# Patient Record
Sex: Male | Born: 1950 | Race: White | Hispanic: No | Smoking: Former smoker
Health system: Southern US, Community
[De-identification: ages and names within clinical notes are randomized; demographics above are authoritative.]

## PROBLEM LIST (undated history)

## (undated) DIAGNOSIS — F329 Major depressive disorder, single episode, unspecified: Secondary | ICD-10-CM

## (undated) DIAGNOSIS — K219 Gastro-esophageal reflux disease without esophagitis: Secondary | ICD-10-CM

## (undated) DIAGNOSIS — F32A Depression, unspecified: Secondary | ICD-10-CM

## (undated) DIAGNOSIS — E7800 Pure hypercholesterolemia, unspecified: Secondary | ICD-10-CM

## (undated) DIAGNOSIS — I10 Essential (primary) hypertension: Secondary | ICD-10-CM

## (undated) DIAGNOSIS — J449 Chronic obstructive pulmonary disease, unspecified: Secondary | ICD-10-CM

## (undated) DIAGNOSIS — E119 Type 2 diabetes mellitus without complications: Secondary | ICD-10-CM

## (undated) DIAGNOSIS — K4090 Unilateral inguinal hernia, without obstruction or gangrene, not specified as recurrent: Secondary | ICD-10-CM

## (undated) HISTORY — PX: HERNIA REPAIR: SHX51

---

## 2017-10-29 ENCOUNTER — Other Ambulatory Visit: Payer: Self-pay

## 2017-10-29 ENCOUNTER — Emergency Department (HOSPITAL_COMMUNITY): Payer: Self-pay

## 2017-10-29 ENCOUNTER — Encounter (HOSPITAL_COMMUNITY): Payer: Self-pay | Admitting: Emergency Medicine

## 2017-10-29 ENCOUNTER — Emergency Department (HOSPITAL_COMMUNITY)
Admission: EM | Admit: 2017-10-29 | Discharge: 2017-10-29 | Disposition: A | Discharge status: Skilled Nursing Facility | Payer: Self-pay | Attending: Emergency Medicine | Admitting: Emergency Medicine

## 2017-10-29 DIAGNOSIS — J449 Chronic obstructive pulmonary disease, unspecified: Secondary | ICD-10-CM | POA: Insufficient documentation

## 2017-10-29 DIAGNOSIS — Y999 Unspecified external cause status: Secondary | ICD-10-CM | POA: Insufficient documentation

## 2017-10-29 DIAGNOSIS — I10 Essential (primary) hypertension: Secondary | ICD-10-CM | POA: Insufficient documentation

## 2017-10-29 DIAGNOSIS — Y9301 Activity, walking, marching and hiking: Secondary | ICD-10-CM | POA: Insufficient documentation

## 2017-10-29 DIAGNOSIS — M25551 Pain in right hip: Secondary | ICD-10-CM

## 2017-10-29 DIAGNOSIS — W19XXXA Unspecified fall, initial encounter: Secondary | ICD-10-CM

## 2017-10-29 DIAGNOSIS — Y9210 Unspecified residential institution as the place of occurrence of the external cause: Secondary | ICD-10-CM | POA: Insufficient documentation

## 2017-10-29 DIAGNOSIS — E119 Type 2 diabetes mellitus without complications: Secondary | ICD-10-CM | POA: Insufficient documentation

## 2017-10-29 HISTORY — DX: Pure hypercholesterolemia, unspecified: E78.00

## 2017-10-29 HISTORY — DX: Depression, unspecified: F32.A

## 2017-10-29 HISTORY — DX: Type 2 diabetes mellitus without complications: E11.9

## 2017-10-29 HISTORY — DX: Major depressive disorder, single episode, unspecified: F32.9

## 2017-10-29 HISTORY — DX: Gastro-esophageal reflux disease without esophagitis: K21.9

## 2017-10-29 HISTORY — DX: Essential (primary) hypertension: I10

## 2017-10-29 HISTORY — DX: Chronic obstructive pulmonary disease, unspecified: J44.9

## 2017-10-29 LAB — I-STAT CHEM 8, ED
BUN: 35 mg/dL — AB (ref 8–23)
CHLORIDE: 101 mmol/L (ref 98–111)
Calcium, Ion: 1.12 mmol/L — ABNORMAL LOW (ref 1.15–1.40)
Creatinine, Ser: 2.3 mg/dL — ABNORMAL HIGH (ref 0.61–1.24)
GLUCOSE: 91 mg/dL (ref 70–99)
HCT: 30 % — ABNORMAL LOW (ref 39.0–52.0)
Hemoglobin: 10.2 g/dL — ABNORMAL LOW (ref 13.0–17.0)
POTASSIUM: 3.3 mmol/L — AB (ref 3.5–5.1)
SODIUM: 139 mmol/L (ref 135–145)
TCO2: 26 mmol/L (ref 22–32)

## 2017-10-29 LAB — CK: CK TOTAL: 27 U/L — AB (ref 49–397)

## 2017-10-29 MED ORDER — BACITRACIN-NEOMYCIN-POLYMYXIN 400-5-5000 EX OINT
TOPICAL_OINTMENT | CUTANEOUS | Status: DC
Start: 2017-10-27 — End: 2017-10-29

## 2017-10-29 MED ORDER — GENERIC EXTERNAL MEDICATION
Status: DC
Start: ? — End: 2017-10-29

## 2017-10-29 MED ORDER — DULOXETINE HCL 30 MG PO CPEP
60.00 | ORAL_CAPSULE | ORAL | Status: DC
Start: 2017-10-28 — End: 2017-10-29

## 2017-10-29 MED ORDER — SENNOSIDES 8.6 MG PO TABS
17.20 | ORAL_TABLET | ORAL | Status: DC
Start: 2017-10-27 — End: 2017-10-29

## 2017-10-29 MED ORDER — FOLIC ACID 1 MG PO TABS
1.00 | ORAL_TABLET | ORAL | Status: DC
Start: 2017-10-28 — End: 2017-10-29

## 2017-10-29 MED ORDER — ALBUTEROL SULFATE (2.5 MG/3ML) 0.083% IN NEBU
2.50 | INHALATION_SOLUTION | RESPIRATORY_TRACT | Status: DC
Start: ? — End: 2017-10-29

## 2017-10-29 MED ORDER — LIDOCAINE 5 % EX PTCH
1.00 | MEDICATED_PATCH | CUTANEOUS | Status: DC
Start: 2017-10-28 — End: 2017-10-29

## 2017-10-29 MED ORDER — THIAMINE HCL 100 MG PO TABS
100.00 | ORAL_TABLET | ORAL | Status: DC
Start: 2017-10-28 — End: 2017-10-29

## 2017-10-29 MED ORDER — ERYTHROMYCIN 5 MG/GM OP OINT
TOPICAL_OINTMENT | OPHTHALMIC | Status: DC
Start: 2017-10-27 — End: 2017-10-29

## 2017-10-29 MED ORDER — HYDROCODONE-ACETAMINOPHEN 5-325 MG PO TABS
1.00 | ORAL_TABLET | ORAL | Status: DC
Start: ? — End: 2017-10-29

## 2017-10-29 MED ORDER — ATORVASTATIN CALCIUM 40 MG PO TABS
40.00 | ORAL_TABLET | ORAL | Status: DC
Start: 2017-10-27 — End: 2017-10-29

## 2017-10-29 MED ORDER — GENERIC EXTERNAL MEDICATION
2.00 | Status: DC
Start: 2017-10-27 — End: 2017-10-29

## 2017-10-29 MED ORDER — DEXTROSE 10 % IV SOLN
125.00 | INTRAVENOUS | Status: DC
Start: ? — End: 2017-10-29

## 2017-10-29 MED ORDER — MELATONIN 3 MG PO TABS
6.00 | ORAL_TABLET | ORAL | Status: DC
Start: 2017-10-27 — End: 2017-10-29

## 2017-10-29 MED ORDER — GENERIC EXTERNAL MEDICATION
1.00 | Status: DC
Start: 2017-10-28 — End: 2017-10-29

## 2017-10-29 MED ORDER — ACETAMINOPHEN 325 MG PO TABS
650.00 | ORAL_TABLET | ORAL | Status: DC
Start: ? — End: 2017-10-29

## 2017-10-29 MED ORDER — PANTOPRAZOLE SODIUM 40 MG PO TBEC
40.00 | DELAYED_RELEASE_TABLET | ORAL | Status: DC
Start: 2017-10-28 — End: 2017-10-29

## 2017-10-29 MED ORDER — IPRATROPIUM-ALBUTEROL 0.5-2.5 (3) MG/3ML IN SOLN
3.00 | RESPIRATORY_TRACT | Status: DC
Start: ? — End: 2017-10-29

## 2017-10-29 MED ORDER — CYCLOBENZAPRINE HCL 10 MG PO TABS
10.00 | ORAL_TABLET | ORAL | Status: DC
Start: ? — End: 2017-10-29

## 2017-10-29 MED ORDER — HEPARIN SODIUM (PORCINE) 5000 UNIT/ML IJ SOLN
5000.00 | INTRAMUSCULAR | Status: DC
Start: 2017-10-27 — End: 2017-10-29

## 2017-10-29 MED ORDER — CARVEDILOL 12.5 MG PO TABS
12.50 | ORAL_TABLET | ORAL | Status: DC
Start: 2017-10-28 — End: 2017-10-29

## 2017-10-29 MED ORDER — ASPIRIN 81 MG PO CHEW
81.00 | CHEWABLE_TABLET | ORAL | Status: DC
Start: 2017-10-28 — End: 2017-10-29

## 2017-10-29 MED ORDER — INSULIN LISPRO 100 UNIT/ML ~~LOC~~ SOLN
3.00 | SUBCUTANEOUS | Status: DC
Start: 2017-10-27 — End: 2017-10-29

## 2017-10-29 MED ORDER — TIOTROPIUM BROMIDE MONOHYDRATE 18 MCG IN CAPS
1.00 | ORAL_CAPSULE | RESPIRATORY_TRACT | Status: DC
Start: 2017-10-28 — End: 2017-10-29

## 2017-10-29 NOTE — ED Notes (Signed)
[facility] Expected date:  Expected time:  Means of arrival:  Comments: EMS From SNF-fall/dementia
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
## 2017-10-29 NOTE — Discharge Instructions (Signed)
Your imaging in the emergency department was reassuring.  Take tylenol for pain as needed.  Have your kidney function rechecked by your primary care doctor within the week.  You may return for new or concerning symptoms.

## 2018-03-31 ENCOUNTER — Emergency Department (HOSPITAL_COMMUNITY): Payer: Medicare Other

## 2018-03-31 ENCOUNTER — Inpatient Hospital Stay (HOSPITAL_COMMUNITY): Payer: Medicare Other

## 2018-03-31 ENCOUNTER — Inpatient Hospital Stay (HOSPITAL_COMMUNITY)
Admission: EM | Admit: 2018-03-31 | Discharge: 2018-04-02 | DRG: 536 | Disposition: A | Discharge status: Skilled Nursing Facility | Payer: Medicare Other | Source: Skilled Nursing Facility | Attending: Internal Medicine | Admitting: Internal Medicine

## 2018-03-31 ENCOUNTER — Encounter (HOSPITAL_COMMUNITY): Payer: Self-pay

## 2018-03-31 ENCOUNTER — Other Ambulatory Visit: Payer: Self-pay

## 2018-03-31 DIAGNOSIS — R109 Unspecified abdominal pain: Secondary | ICD-10-CM

## 2018-03-31 DIAGNOSIS — E1122 Type 2 diabetes mellitus with diabetic chronic kidney disease: Secondary | ICD-10-CM | POA: Diagnosis present

## 2018-03-31 DIAGNOSIS — F329 Major depressive disorder, single episode, unspecified: Secondary | ICD-10-CM | POA: Diagnosis present

## 2018-03-31 DIAGNOSIS — Z880 Allergy status to penicillin: Secondary | ICD-10-CM

## 2018-03-31 DIAGNOSIS — E785 Hyperlipidemia, unspecified: Secondary | ICD-10-CM | POA: Diagnosis present

## 2018-03-31 DIAGNOSIS — F17210 Nicotine dependence, cigarettes, uncomplicated: Secondary | ICD-10-CM | POA: Diagnosis present

## 2018-03-31 DIAGNOSIS — E1142 Type 2 diabetes mellitus with diabetic polyneuropathy: Secondary | ICD-10-CM | POA: Diagnosis present

## 2018-03-31 DIAGNOSIS — N183 Chronic kidney disease, stage 3 (moderate): Secondary | ICD-10-CM | POA: Diagnosis present

## 2018-03-31 DIAGNOSIS — E860 Dehydration: Secondary | ICD-10-CM | POA: Diagnosis present

## 2018-03-31 DIAGNOSIS — N289 Disorder of kidney and ureter, unspecified: Secondary | ICD-10-CM

## 2018-03-31 DIAGNOSIS — K5900 Constipation, unspecified: Secondary | ICD-10-CM | POA: Diagnosis present

## 2018-03-31 DIAGNOSIS — J449 Chronic obstructive pulmonary disease, unspecified: Secondary | ICD-10-CM | POA: Diagnosis present

## 2018-03-31 DIAGNOSIS — D72829 Elevated white blood cell count, unspecified: Secondary | ICD-10-CM | POA: Diagnosis present

## 2018-03-31 DIAGNOSIS — I129 Hypertensive chronic kidney disease with stage 1 through stage 4 chronic kidney disease, or unspecified chronic kidney disease: Secondary | ICD-10-CM | POA: Diagnosis present

## 2018-03-31 DIAGNOSIS — S72091A Other fracture of head and neck of right femur, initial encounter for closed fracture: Principal | ICD-10-CM | POA: Diagnosis present

## 2018-03-31 DIAGNOSIS — X58XXXA Exposure to other specified factors, initial encounter: Secondary | ICD-10-CM | POA: Diagnosis present

## 2018-03-31 DIAGNOSIS — F419 Anxiety disorder, unspecified: Secondary | ICD-10-CM | POA: Diagnosis present

## 2018-03-31 DIAGNOSIS — E119 Type 2 diabetes mellitus without complications: Secondary | ICD-10-CM

## 2018-03-31 DIAGNOSIS — S72009A Fracture of unspecified part of neck of unspecified femur, initial encounter for closed fracture: Secondary | ICD-10-CM | POA: Diagnosis present

## 2018-03-31 DIAGNOSIS — S72001A Fracture of unspecified part of neck of right femur, initial encounter for closed fracture: Secondary | ICD-10-CM

## 2018-03-31 DIAGNOSIS — I10 Essential (primary) hypertension: Secondary | ICD-10-CM

## 2018-03-31 DIAGNOSIS — D649 Anemia, unspecified: Secondary | ICD-10-CM | POA: Diagnosis present

## 2018-03-31 DIAGNOSIS — Z993 Dependence on wheelchair: Secondary | ICD-10-CM

## 2018-03-31 DIAGNOSIS — Z887 Allergy status to serum and vaccine status: Secondary | ICD-10-CM | POA: Diagnosis not present

## 2018-03-31 DIAGNOSIS — R10819 Abdominal tenderness, unspecified site: Secondary | ICD-10-CM

## 2018-03-31 DIAGNOSIS — E875 Hyperkalemia: Secondary | ICD-10-CM | POA: Diagnosis present

## 2018-03-31 HISTORY — DX: Unilateral inguinal hernia, without obstruction or gangrene, not specified as recurrent: K40.90

## 2018-03-31 LAB — GLUCOSE, CAPILLARY
Glucose-Capillary: 145 mg/dL — ABNORMAL HIGH (ref 70–99)
Glucose-Capillary: 104 mg/dL — ABNORMAL HIGH (ref 70–99)
Glucose-Capillary: 125 mg/dL — ABNORMAL HIGH (ref 70–99)
Glucose-Capillary: 114 mg/dL — ABNORMAL HIGH (ref 70–99)

## 2018-03-31 LAB — COMPREHENSIVE METABOLIC PANEL
ALT: 23 U/L (ref 0–44)
AST: 21 U/L (ref 15–41)
Albumin: 3.3 g/dL — ABNORMAL LOW (ref 3.5–5.0)
Alkaline Phosphatase: 97 U/L (ref 38–126)
Anion gap: 10 (ref 5–15)
BUN: 45 mg/dL — AB (ref 8–23)
CHLORIDE: 103 mmol/L (ref 98–111)
CO2: 26 mmol/L (ref 22–32)
Calcium: 8.8 mg/dL — ABNORMAL LOW (ref 8.9–10.3)
Creatinine, Ser: 2.41 mg/dL — ABNORMAL HIGH (ref 0.61–1.24)
GFR, EST AFRICAN AMERICAN: 31 mL/min — AB (ref >60)
GFR, EST NON AFRICAN AMERICAN: 27 mL/min — AB (ref >60)
Glucose, Bld: 120 mg/dL — ABNORMAL HIGH (ref 70–99)
POTASSIUM: 4.4 mmol/L (ref 3.5–5.1)
Sodium: 139 mmol/L (ref 135–145)
Total Bilirubin: 0.6 mg/dL (ref 0.3–1.2)
Total Protein: 6.9 g/dL (ref 6.5–8.1)

## 2018-03-31 LAB — URINALYSIS, ROUTINE W REFLEX MICROSCOPIC
Bilirubin Urine: NEGATIVE
Glucose, UA: NEGATIVE mg/dL
Ketones, ur: NEGATIVE mg/dL
Nitrite: NEGATIVE
Protein, ur: 30 mg/dL — AB
Specific Gravity, Urine: 1.012 (ref 1.005–1.030)
WBC, UA: >50 WBC/hpf — ABNORMAL HIGH (ref 0–5)
pH: 6 (ref 5.0–8.0)

## 2018-03-31 LAB — CBC WITH DIFFERENTIAL/PLATELET
Abs Immature Granulocytes: 0.14 10*3/uL — ABNORMAL HIGH (ref 0.00–0.07)
BASOS ABS: 0 10*3/uL (ref 0.0–0.1)
BASOS PCT: 0 %
Eosinophils Absolute: 0.5 10*3/uL (ref 0.0–0.5)
Eosinophils Relative: 5 %
HCT: 29.8 % — ABNORMAL LOW (ref 39.0–52.0)
HEMOGLOBIN: 9.1 g/dL — AB (ref 13.0–17.0)
Immature Granulocytes: 1 %
LYMPHS PCT: 23 %
Lymphs Abs: 2.6 10*3/uL (ref 0.7–4.0)
MCH: 29.7 pg (ref 26.0–34.0)
MCHC: 30.5 g/dL (ref 30.0–36.0)
MCV: 97.4 fL (ref 80.0–100.0)
MONO ABS: 1 10*3/uL (ref 0.1–1.0)
Monocytes Relative: 9 %
NEUTROS ABS: 6.9 10*3/uL (ref 1.7–7.7)
Neutrophils Relative %: 62 %
PLATELETS: 290 10*3/uL (ref 150–400)
RBC: 3.06 MIL/uL — AB (ref 4.22–5.81)
RDW: 14.2 % (ref 11.5–15.5)
WBC: 11.2 10*3/uL — AB (ref 4.0–10.5)
nRBC: 0 % (ref 0.0–0.2)

## 2018-03-31 LAB — PROTIME-INR
INR: 0.96
PROTHROMBIN TIME: 12.7 s (ref 11.4–15.2)

## 2018-03-31 LAB — MRSA PCR SCREENING: MRSA by PCR: POSITIVE — AB

## 2018-03-31 MED ORDER — SODIUM CHLORIDE 0.9 % IV SOLN
INTRAVENOUS | Status: DC
  Administered 2018-03-31: 04:00:00 via INTRAVENOUS

## 2018-03-31 MED ORDER — BISACODYL 10 MG RE SUPP
10.0000 mg | Freq: Once | RECTAL | Status: AC
  Administered 2018-03-31: 10 mg via RECTAL
  Filled 2018-03-31: qty 1

## 2018-03-31 MED ORDER — OMEGA-3-ACID ETHYL ESTERS 1 G PO CAPS
2.0000 g | ORAL_CAPSULE | Freq: Three times a day (TID) | ORAL | Status: DC
  Administered 2018-03-31 – 2018-04-02 (×7): 2 g via ORAL
  Filled 2018-03-31 (×7): qty 2

## 2018-03-31 MED ORDER — TAMSULOSIN HCL 0.4 MG PO CAPS
0.4000 mg | ORAL_CAPSULE | Freq: Every day | ORAL | Status: DC
  Administered 2018-03-31 – 2018-04-02 (×3): 0.4 mg via ORAL
  Filled 2018-03-31 (×3): qty 1

## 2018-03-31 MED ORDER — MORPHINE SULFATE (PF) 4 MG/ML IV SOLN
4.0000 mg | Freq: Once | INTRAVENOUS | Status: AC
  Administered 2018-03-31: 4 mg via INTRAVENOUS
  Filled 2018-03-31: qty 1

## 2018-03-31 MED ORDER — DULOXETINE HCL 60 MG PO CPEP: 60.0000 mg | ORAL_CAPSULE | Freq: Every morning | ORAL | Status: DC

## 2018-03-31 MED ORDER — FLUTICASONE PROPIONATE 50 MCG/ACT NA SUSP
2.0000 | Freq: Every day | NASAL | Status: DC
  Administered 2018-03-31 – 2018-04-02 (×3): 2 via NASAL
  Filled 2018-03-31: qty 16

## 2018-03-31 MED ORDER — POLYETHYLENE GLYCOL 3350 17 G PO PACK
17.0000 g | PACK | Freq: Two times a day (BID) | ORAL | Status: DC
  Administered 2018-03-31 – 2018-04-01 (×3): 17 g via ORAL
  Filled 2018-03-31 (×4): qty 1

## 2018-03-31 MED ORDER — HYDROCODONE-ACETAMINOPHEN 5-325 MG PO TABS
1.0000 | ORAL_TABLET | Freq: Four times a day (QID) | ORAL | Status: DC | PRN
  Administered 2018-03-31 – 2018-04-01 (×3): 1 via ORAL
  Administered 2018-04-01 (×2): 2 via ORAL
  Administered 2018-04-02: 1 via ORAL
  Filled 2018-03-31: qty 2
  Filled 2018-03-31 (×2): qty 1
  Filled 2018-03-31: qty 2
  Filled 2018-03-31 (×2): qty 1
  Filled 2018-03-31: qty 2

## 2018-03-31 MED ORDER — ADULT MULTIVITAMIN W/MINERALS CH
1.0000 | ORAL_TABLET | Freq: Every day | ORAL | Status: DC
  Administered 2018-03-31 – 2018-04-01 (×2): 1 via ORAL
  Filled 2018-03-31 (×2): qty 1

## 2018-03-31 MED ORDER — FERROUS GLUCONATE 324 (38 FE) MG PO TABS
324.0000 mg | ORAL_TABLET | Freq: Every day | ORAL | Status: DC
  Administered 2018-03-31 – 2018-04-01 (×2): 324 mg via ORAL
  Filled 2018-03-31 (×3): qty 1

## 2018-03-31 MED ORDER — UMECLIDINIUM BROMIDE 62.5 MCG/INH IN AEPB
1.0000 | INHALATION_SPRAY | Freq: Every day | RESPIRATORY_TRACT | Status: DC
  Administered 2018-04-01: 1 via RESPIRATORY_TRACT
  Filled 2018-03-31: qty 7

## 2018-03-31 MED ORDER — ALBUTEROL SULFATE (2.5 MG/3ML) 0.083% IN NEBU: 2.5000 mg | INHALATION_SOLUTION | Freq: Four times a day (QID) | RESPIRATORY_TRACT | Status: DC | PRN

## 2018-03-31 MED ORDER — LIP MEDEX EX OINT
TOPICAL_OINTMENT | CUTANEOUS | Status: DC | PRN
  Administered 2018-03-31: 19:00:00 via TOPICAL
  Filled 2018-03-31: qty 7

## 2018-03-31 MED ORDER — GABAPENTIN 300 MG PO CAPS
600.0000 mg | ORAL_CAPSULE | Freq: Three times a day (TID) | ORAL | Status: DC
  Administered 2018-03-31 – 2018-04-02 (×7): 600 mg via ORAL
  Filled 2018-03-31 (×7): qty 2

## 2018-03-31 MED ORDER — CLONAZEPAM 0.5 MG PO TABS: 0.2500 mg | ORAL_TABLET | Freq: Three times a day (TID) | ORAL | Status: DC | PRN

## 2018-03-31 MED ORDER — CIPROFLOXACIN IN D5W 400 MG/200ML IV SOLN
400.0000 mg | INTRAVENOUS | Status: DC
  Administered 2018-03-31: 400 mg via INTRAVENOUS
  Filled 2018-03-31 (×2): qty 200

## 2018-03-31 MED ORDER — INSULIN ASPART 100 UNIT/ML ~~LOC~~ SOLN
0.0000 [IU] | Freq: Three times a day (TID) | SUBCUTANEOUS | Status: DC
  Administered 2018-03-31 (×2): 1 [IU] via SUBCUTANEOUS

## 2018-03-31 MED ORDER — SODIUM CHLORIDE 0.9 % IV SOLN
INTRAVENOUS | Status: DC
  Administered 2018-03-31 – 2018-04-01 (×4): via INTRAVENOUS

## 2018-03-31 MED ORDER — TRAZODONE HCL 100 MG PO TABS: 200.0000 mg | ORAL_TABLET | Freq: Every day | ORAL | Status: DC

## 2018-03-31 MED ORDER — MORPHINE SULFATE (PF) 2 MG/ML IV SOLN: 0.5000 mg | INTRAVENOUS | Status: DC | PRN

## 2018-03-31 MED ORDER — SENNOSIDES-DOCUSATE SODIUM 8.6-50 MG PO TABS
1.0000 | ORAL_TABLET | Freq: Two times a day (BID) | ORAL | Status: DC
  Administered 2018-03-31 – 2018-04-02 (×5): 1 via ORAL
  Filled 2018-03-31 (×5): qty 1

## 2018-03-31 NOTE — H&P (Signed)
History and Physical    Bruce Garcia YSH:[id number] DOB: 1950-04-28 DOA: 03/31/2018  PCP: Bernette Redbird, MD Patient coming from: Nursing Home  Chief Complaint: Right hip pain  HPI: Bruce Garcia is a 68 y.o. male with medical history significant of COPD, depression, type 2 diabetes, GERD, hypertension, hyperlipidemia presenting from a nursing home for evaluation of right hip pain.  Patient reports having pain in his right hip for the past 3 months.  Denies any history of falls or trauma to the area.  States he has been wheelchair-bound for the past few months and not been able to walk due to the pain.  He has no other complaints.  Denies having any fevers, chills, chest pain, shortness of breath, abdominal pain, nausea, vomiting, or diarrhea.  Does report constipation, last bowel movement a week ago.  Review of Systems: As per HPI otherwise 10 point review of systems negative.  Past Medical History:  Diagnosis Date  . COPD (chronic obstructive pulmonary disease) (HCC)   . Depression   . Diabetes mellitus without complication (HCC)    Type II  . GERD (gastroesophageal reflux disease)   . Hypercholesteremia   . Hypertension   . Inguinal hernia    Right    Past Surgical History:  Procedure Laterality Date  . HERNIA REPAIR       reports that he has been smoking. He has been smoking about 1.50 packs per day. He has never used smokeless tobacco. He reports current alcohol use. He reports previous drug use.  Allergies  Allergen Reactions  . Chicken Allergy Itching, Nausea And Vomiting and Swelling    Patient reports allergy to chicken.   . Flu Virus Vaccine Nausea And Vomiting  . Penicillins Anaphylaxis    Did it involve swelling of the face/tongue/throat, SOB, or low BP? Y Did it involve sudden or severe rash/hives, skin peeling, or any reaction on the inside of your mouth or nose? Y Did you need to seek medical attention at a hospital or doctor's office? UNK When did it last  happen?childhood If all above answers are "NO", may proceed with cephalosporin use.   . Pneumococcal Vaccine Nausea And Vomiting    History reviewed. No pertinent family history.  Prior to Admission medications   Medication Sig Start Date End Date Taking? Authorizing Provider  albuterol (PROVENTIL HFA;VENTOLIN HFA) 108 (90 Base) MCG/ACT inhaler Inhale 2 puffs into the lungs every 6 (six) hours as needed for wheezing or shortness of breath.   Yes [provider]  albuterol (PROVENTIL) (2.5 MG/3ML) 0.083% nebulizer solution Take 2.5 mg by nebulization every 6 (six) hours as needed for wheezing or shortness of breath.   Yes [provider]  clonazePAM (KLONOPIN) 0.5 MG tablet Take 0.25 mg by mouth 3 (three) times daily as needed for anxiety (nerves).   Yes [provider]  DULoxetine (CYMBALTA) 60 MG capsule Take 60 mg by mouth every morning.   Yes [provider]  ferrous gluconate (FERGON) 324 MG tablet Take 324 mg by mouth daily with breakfast.   Yes [provider]  fluticasone (FLONASE) 50 MCG/ACT nasal spray Place 2 sprays into both nostrils daily.   Yes [provider]  gabapentin (NEURONTIN) 300 MG capsule Take 600 mg by mouth 3 (three) times daily.   Yes [provider]  lidocaine (LIDODERM) 5 % Place 1 patch onto the skin daily. Remove & Discard patch within 12 hours or as directed by MD   Yes [provider]  Melatonin 3 MG TABS Take 2 tablets by mouth at bedtime as needed (sleep).   Yes [provider]  Multiple Vitamin (MULTIVITAMIN WITH MINERALS) TABS tablet Take 1 tablet by mouth daily.   Yes [provider]  naloxone (NARCAN) nasal spray 4 mg/0.1 mL Place 1 spray into the nose once.   Yes [provider]  omega-3 acid ethyl esters (LOVAZA) 1 g capsule Take 2 g by mouth 3 (three) times daily.   Yes [provider]  tiotropium (SPIRIVA) 18 MCG inhalation capsule Place 18  mcg into inhaler and inhale daily.   Yes [provider]  traZODone (DESYREL) 100 MG tablet Take 200 mg by mouth at bedtime.   Yes [provider]    Physical Exam: Vitals:   03/31/18 0051 03/31/18 0200 03/31/18 0308 03/31/18 0329  BP:  (!) 115/47 (!) 128/54 133/61  Pulse:  75 75 75  Resp:  15 15 14   Temp:   98 F (36.7 C)   TempSrc:   Oral   SpO2:  99% 97% 100%  Weight: 70.8 kg     Height: 5\' 8"  (1.727 m)       Physical Exam  Constitutional: He is oriented to person, place, and time. He appears well-developed and well-nourished. No distress.  HENT:  Head: Normocephalic.  Dry mucous membranes  Eyes: Right eye exhibits no discharge. Left eye exhibits no discharge.  Neck: Neck supple.  Cardiovascular: Normal rate, regular rhythm and intact distal pulses.  Pulmonary/Chest: Effort normal and breath sounds normal. No respiratory distress. He has no wheezes. He has no rales.  Abdominal: Soft. Bowel sounds are normal. He exhibits distension. There is abdominal tenderness. There is guarding. There is no rebound.  Generalized tenderness to palpation Abdomen appears mildly distended  Musculoskeletal:        General: No edema.  Neurological: He is alert and oriented to person, place, and time.  Skin: Skin is warm and dry. He is not diaphoretic.     Labs on Admission: I have personally reviewed following labs and imaging studies  CBC: Recent Labs  Lab 03/31/18 0200  WBC 11.2*  NEUTROABS 6.9  HGB 9.1*  HCT 29.8*  MCV 97.4  PLT 290   Basic Metabolic Panel: Recent Labs  Lab 03/31/18 0200  NA 139  K 4.4  CL 103  CO2 26  GLUCOSE 120*  BUN 45*  CREATININE 2.41*  CALCIUM 8.8*   GFR: Estimated Creatinine Clearance: 29.6 mL/min (A) (by C-G formula based on SCr of 2.41 mg/dL (H)). Liver Function Tests: Recent Labs  Lab 03/31/18 0200  AST 21  ALT 23  ALKPHOS 97  BILITOT 0.6  PROT 6.9  ALBUMIN 3.3*   No results for input(s): LIPASE, AMYLASE in the  last 168 hours. No results for input(s): AMMONIA in the last 168 hours. Coagulation Profile: Recent Labs  Lab 03/31/18 0200  INR 0.96   Cardiac Enzymes: No results for input(s): CKTOTAL, CKMB, CKMBINDEX, TROPONINI in the last 168 hours. BNP (last 3 results) No results for input(s): PROBNP in the last 8760 hours. HbA1C: No results for input(s): HGBA1C in the last 72 hours. CBG: No results for input(s): GLUCAP in the last 168 hours. Lipid Profile: No results for input(s): CHOL, HDL, LDLCALC, TRIG, CHOLHDL, LDLDIRECT in the last 72 hours. Thyroid Function Tests: No results for input(s): TSH, T4TOTAL, FREET4, T3FREE, THYROIDAB in the last 72 hours. Anemia Panel: No results for input(s): VITAMINB12, FOLATE, FERRITIN, TIBC, IRON, RETICCTPCT in the last  72 hours. Urine analysis: No results found for: COLORURINE, APPEARANCEUR, LABSPEC, PHURINE, GLUCOSEU, HGBUR, BILIRUBINUR, KETONESUR, PROTEINUR, UROBILINOGEN, NITRITE, LEUKOCYTESUR  Radiological Exams on Admission: Ct Hip Right Wo Contrast  Result Date: 03/31/2018 CLINICAL DATA:  68 year old male with right femoral neck fracture. EXAM: CT OF THE RIGHT HIP WITHOUT CONTRAST TECHNIQUE: Multidetector CT imaging of the right hip was performed according to the standard protocol. Multiplanar CT image reconstructions were also generated. COMPARISON:  Right hip radiograph dated 03/31/2018 and 10/29/2017 FINDINGS: Bones/Joint/Cartilage There is an impacted transverse fracture of the right femoral neck. There is no dislocation. There is advanced osteopenia. Ligaments Suboptimally assessed by CT. Muscles and Tendons No acute findings. Soft tissues No acute findings. IMPRESSION: Impacted transverse fracture of the right femoral neck. Electronically Signed   By: Elgie CollardArash  Radparvar M.D.   On: 03/31/2018 03:01   Dg Chest Portable 1 View  Result Date: 03/31/2018 CLINICAL DATA:  Preop for hip surgery. Hypertension. COPD. Smoker. EXAM: PORTABLE CHEST 1 VIEW  COMPARISON:  None. FINDINGS: Midline trachea. Normal heart size. Atherosclerosis in the transverse aorta. No pleural effusion or pneumothorax. Mild hyperinflation. Clear lungs. IMPRESSION: 1. Hyperinflation, without acute disease. 2. Aortic atherosclerosis. Electronically Signed   By: Jeronimo GreavesKyle  Talbot M.D.   On: 03/31/2018 02:08   Dg Hip Unilat  With Pelvis 2-3 Views Right  Result Date: 03/31/2018 CLINICAL DATA:  Hip pain EXAM: DG HIP (WITH OR WITHOUT PELVIS) 2-3V RIGHT COMPARISON:  10/29/2017 FINDINGS: Superiorly displaced, overriding fracture of the right femoral neck. Femoral head remains situated in the acetabulum. No other pelvic fracture. No pelvic diastasis. IMPRESSION: Superiorly displaced, overriding fracture of the right femoral neck. Electronically Signed   By: Deatra RobinsonKevin  Herman M.D.   On: 03/31/2018 01:05    EKG: Independently reviewed.  Sinus rhythm (heart rate 76).  Assessment/Plan Principal Problem:   Hip fracture (HCC) Active Problems:   Leukocytosis   Anemia   Renal insufficiency   HTN (hypertension)   COPD (chronic obstructive pulmonary disease) (HCC)   Type 2 diabetes mellitus (HCC)   Abdominal tenderness   Anxiety   Right hip fracture Patient reports having right hip pain for the past 3 months but denies any history of falls or trauma to the area.  Imaging with evidence of right femoral neck fracture.  ED provider spoke to Dr. Magnus IvanBlackman, Ortho will consult on the patient in the morning. -Keep n.p.o. -IV fluid hydration -Pain management: Morphine 0.5 mg every 2 hours as needed, Norco 5-325 mg 1 to 2 tablets every 6 hours as needed  Mild leukocytosis Likely reactive.  White count 11.2.  Patient is afebrile.  Chest x-ray without evidence of pneumonia. -Check UA -Continue to monitor CBC  Abdominal tenderness Patient not complaining of abdominal pain, nausea, vomiting, or diarrhea.  Does report constipation, last bowel movement a week ago.  On exam, noted to have distention,  generalized tenderness, and guarding.  No rebound or rigidity. -Abdominal x-ray  Anemia Hemoglobin 9.1, was 10.2 five months ago.  No signs of active bleeding. -Continue to monitor -Continue home iron supplement -Outpatient follow-up with PCP to ensure that his colon cancer screening is up-to-date.  Chronic renal insufficiency GFR 27.  BUN 45.  Creatinine 2.4, was 2.3 five months ago.  Patient appears dehydrated on exam. -IV fluid hydration -Avoid nephrotoxic agents/contrast -Continue to monitor renal function -Monitor urine output  Type 2 diabetes -Check A1c -Sliding scale insulin sensitive -CBG checks  COPD -Stable.  No bronchospasm.  Continue home inhalers.  Anxiety -Continue home  Klonopin as needed  Hypertension -Currently normotensive.  Not on any home medications.  Continue to monitor blood pressure.  Neuropathy -Continue gabapentin  DVT prophylaxis: SCDs Code Status: Patient wishes to be full code. Family Communication: No family available. Disposition Plan: Anticipate discharge after surgery. Consults called: Orthopedics Admission status: It is my clinical opinion that admission to INPATIENT is reasonable and necessary in this 68 y.o. male . presenting with symptoms of right hip pain, concerning for hip fracture . with pertinent positives on physical exam including: Right hip pain . and pertinent positives on radiographic and laboratory data including: Imaging with evidence of right femoral neck fracture . Workup and treatment include keep n.p.o., IV fluid hydration, Ortho will see the patient in the morning and plan for surgery.  Given the aforementioned, the predictability of an adverse outcome is felt to be significant. I expect that the patient will require at least 2 midnights in the hospital to treat this condition.    John GiovanniVasundhra Perris Tripathi MD [facility] Pager [phone]  If 7PM-7AM, please contact night-coverage [email or web address] Password  [facility]  03/31/2018, 5:51 AM

## 2018-03-31 NOTE — ED Notes (Signed)
WILL TRANSPORT PT TO 3W 1327-1. AAOX4. PT IN NO APPARENT DISTRESS WITH MODERATE PAIN. THE OPPORTUNITY TO ASK QUESTIONS WAS PROVIDED.

## 2018-03-31 NOTE — Progress Notes (Signed)
Patient seen and examined.  HPI review. I agree patient appears dehydrated.  No bowel movement in a week.  1-Dehydration; continue with IV fluids overnight.  2-Constipation; started MiraLAX, Senokot.  Dulcolax suppository.  3-Hip fracture; Dr. Magnus Ivan planning to arrange surgery as an outpatient.  4-chronic kidney disease, stage; stage III Creatinine at 2.4, 67-month ago. Continue with IV fluids.  5-Diabetes: Continue with a sliding scale.  6-COPD continue with home inhalers.  7-Leukocytosis; UA with more than 50 white blood cell.  Check urine culture.  Hartley Barefoot, MD.

## 2018-03-31 NOTE — ED Notes (Signed)
ED TO INPATIENT HANDOFF REPORT  Name/Age/Gender Bruce Garcia 68 y.o. male  Code Status   Home/SNF/Other Nursing Home  Chief Complaint fracture  Level of Care/Admitting Diagnosis ED Disposition    ED Disposition Condition Comment   Admit  Hospital Area: [facility] [[id number]]  Level of Care: Med-Surg [16]  Diagnosis: Hip fracture [facility] [[id number]]  Admitting Physician: John Giovanni [[id number]]  Attending Physician: John Giovanni [[id number]]  Estimated length of stay: past midnight tomorrow  Certification:: I certify this patient will need inpatient services for at least 2 midnights  PT Class (Do Not Modify): Inpatient [101]  PT Acc Code (Do Not Modify): Private [1]       Medical History Past Medical History:  Diagnosis Date  . COPD (chronic obstructive pulmonary disease) (HCC)   . Depression   . Diabetes mellitus without complication (HCC)    Type II  . GERD (gastroesophageal reflux disease)   . Hypercholesteremia   . Hypertension   . Inguinal hernia    Right    Allergies Allergies  Allergen Reactions  . Chicken Allergy Itching, Nausea And Vomiting and Swelling    Patient reports allergy to chicken.   . Flu Virus Vaccine Nausea And Vomiting  . Penicillins Anaphylaxis    Did it involve swelling of the face/tongue/throat, SOB, or low BP? Y Did it involve sudden or severe rash/hives, skin peeling, or any reaction on the inside of your mouth or nose? Y Did you need to seek medical attention at a hospital or doctor's office? UNK When did it last happen?childhood If all above answers are "NO", may proceed with cephalosporin use.   . Pneumococcal Vaccine Nausea And Vomiting    IV Location/Drains/Wounds Patient Lines/Drains/Airways Status   Active Line/Drains/Airways    Name:   Placement date:   Placement time:   Site:   Days:   Peripheral IV 03/31/18 Right Hand   03/31/18    0156    Hand   less than 1           Labs/Imaging Results for orders placed or performed during the hospital encounter of 03/31/18 (from the past 48 hour(s))  CBC with Differential     Status: Abnormal   Collection Time: 03/31/18  2:00 AM  Result Value Ref Range   WBC 11.2 (H) 4.0 - 10.5 K/uL   RBC 3.06 (L) 4.22 - 5.81 MIL/uL   Hemoglobin 9.1 (L) 13.0 - 17.0 g/dL   HCT 11.9 (L) 41.7 - 40.8 %   MCV 97.4 80.0 - 100.0 fL   MCH 29.7 26.0 - 34.0 pg   MCHC 30.5 30.0 - 36.0 g/dL   RDW 14.4 81.8 - 56.3 %   Platelets 290 150 - 400 K/uL   nRBC 0.0 0.0 - 0.2 %   Neutrophils Relative % 62 %   Neutro Abs 6.9 1.7 - 7.7 K/uL   Lymphocytes Relative 23 %   Lymphs Abs 2.6 0.7 - 4.0 K/uL   Monocytes Relative 9 %   Monocytes Absolute 1.0 0.1 - 1.0 K/uL   Eosinophils Relative 5 %   Eosinophils Absolute 0.5 0.0 - 0.5 K/uL   Basophils Relative 0 %   Basophils Absolute 0.0 0.0 - 0.1 K/uL   Immature Granulocytes 1 %   Abs Immature Granulocytes 0.14 (H) 0.00 - 0.07 K/uL    Comment: Performed at [facility], [address], [place], [place] [address]  Protime-INR     Status: None   Collection Time: 03/31/18  2:00 AM  Result Value Ref Range   Prothrombin Time 12.7 11.4 - 15.2 seconds   INR 0.96     Comment: Performed at [facility], [address] [place], [place] [address]  Comprehensive metabolic panel     Status: Abnormal   Collection Time: 03/31/18  2:00 AM  Result Value Ref Range   Sodium 139 135 - 145 mmol/L   Potassium 4.4 3.5 - 5.1 mmol/L   Chloride 103 98 - 111 mmol/L   CO2 26 22 - 32 mmol/L   Glucose, Bld 120 (H) 70 - 99 mg/dL   BUN 45 (H) 8 - 23 mg/dL   Creatinine, Ser 1.912.41 (H) 0.61 - 1.24 mg/dL   Calcium 8.8 (L) 8.9 - 10.3 mg/dL   Total Protein 6.9 6.5 - 8.1 g/dL   Albumin 3.3 (L) 3.5 - 5.0 g/dL   AST 21 15 - 41 U/L   ALT 23 0 - 44 U/L   Alkaline Phosphatase 97 38 - 126 U/L   Total Bilirubin 0.6 0.3 - 1.2 mg/dL   GFR calc non Af Amer 27 (L) >60 mL/min   GFR calc Af Amer  31 (L) >60 mL/min   Anion gap 10 5 - 15    Comment: Performed at [facility], [address] [place], [place] [address]   Ct Hip Right Wo Contrast  Result Date: 03/31/2018 CLINICAL DATA:  68 year old male with right femoral neck fracture. EXAM: CT OF THE RIGHT HIP WITHOUT CONTRAST TECHNIQUE: Multidetector CT imaging of the right hip was performed according to the standard protocol. Multiplanar CT image reconstructions were also generated. COMPARISON:  Right hip radiograph dated 03/31/2018 and 10/29/2017 FINDINGS: Bones/Joint/Cartilage There is an impacted transverse fracture of the right femoral neck. There is no dislocation. There is advanced osteopenia. Ligaments Suboptimally assessed by CT. Muscles and Tendons No acute findings. Soft tissues No acute findings. IMPRESSION: Impacted transverse fracture of the right femoral neck. Electronically Signed   By: Elgie CollardArash  Radparvar M.D.   On: 03/31/2018 03:01   Dg Chest Portable 1 View  Result Date: 03/31/2018 CLINICAL DATA:  Preop for hip surgery. Hypertension. COPD. Smoker. EXAM: PORTABLE CHEST 1 VIEW COMPARISON:  None. FINDINGS: Midline trachea. Normal heart size. Atherosclerosis in the transverse aorta. No pleural effusion or pneumothorax. Mild hyperinflation. Clear lungs. IMPRESSION: 1. Hyperinflation, without acute disease. 2. Aortic atherosclerosis. Electronically Signed   By: Jeronimo GreavesKyle  Talbot M.D.   On: 03/31/2018 02:08   Dg Hip Unilat  With Pelvis 2-3 Views Right  Result Date: 03/31/2018 CLINICAL DATA:  Hip pain EXAM: DG HIP (WITH OR WITHOUT PELVIS) 2-3V RIGHT COMPARISON:  10/29/2017 FINDINGS: Superiorly displaced, overriding fracture of the right femoral neck. Femoral head remains situated in the acetabulum. No other pelvic fracture. No pelvic diastasis. IMPRESSION: Superiorly displaced, overriding fracture of the right femoral neck. Electronically Signed   By: Deatra RobinsonKevin  Herman M.D.   On: 03/31/2018 01:05    Pending Labs Unresulted  Labs (From admission, onward)   None      Vitals/Pain Today's Vitals   03/31/18 0051 03/31/18 0200 03/31/18 0305 03/31/18 0308  BP:  (!) 115/47  (!) 128/54  Pulse:  75  75  Resp:  15  15  Temp:    98 F (36.7 C)  TempSrc:    Oral  SpO2:  99%  97%  Weight: 70.8 kg     Height: 5\' 8"  (1.727 m)     PainSc:   7  Isolation Precautions No active isolations  Medications Medications  morphine 4 MG/ML injection 4 mg (4 mg Intravenous Given 03/31/18 0314)    Mobility manual wheelchair

## 2018-03-31 NOTE — ED Triage Notes (Signed)
Patient arrives by [facility] with complaints of right hip pain since October when he went to [facility]. Patient states he has been complaining of the hip pain since October and they just did the xray Friday during the day.

## 2018-03-31 NOTE — Progress Notes (Signed)
Pharmacy Antibiotic Note  Bruce Garcia is a 68 y.o. male admitted on 03/31/2018 with dehydration, possible UTI. Marland Kitchen  Pharmacy has been consulted for Cipro dosing.  Plan: Cipro 400mg  IV q24h Monitor renal function, cultures, clinical course  Height: 5\' 8"  (172.7 cm) Weight: 156 lb (70.8 kg) IBW/kg (Calculated) : 68.4  Temp (24hrs), Avg:97.9 F (36.6 C), Min:97.8 F (36.6 C), Max:98 F (36.7 C)  Recent Labs  Lab 03/31/18 0200  WBC 11.2*  CREATININE 2.41*    Estimated Creatinine Clearance: 29.6 mL/min (A) (by C-G formula based on SCr of 2.41 mg/dL (H)).    Allergies  Allergen Reactions  . Chicken Allergy Itching, Nausea And Vomiting and Swelling    Patient reports allergy to chicken.   . Flu Virus Vaccine Nausea And Vomiting  . Penicillins Anaphylaxis    Did it involve swelling of the face/tongue/throat, SOB, or low BP? Y Did it involve sudden or severe rash/hives, skin peeling, or any reaction on the inside of your mouth or nose? Y Did you need to seek medical attention at a hospital or doctor's office? UNK When did it last happen?childhood If all above answers are "NO", may proceed with cephalosporin use.   . Pneumococcal Vaccine Nausea And Vomiting    Antimicrobials this admission: 1/18 Cipro >>  Dose adjustments this admission: --  Microbiology results: 1/18 UCx: sent   1/18 MRSA PCR: positive  Thank you for allowing pharmacy to be a part of this patient's care.   Greer Pickerel, PharmD, BCPS Pager: [phone] 03/31/2018 2:46 PM
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
## 2018-03-31 NOTE — ED Provider Notes (Signed)
[facility]-EMERGENCY DEPT Provider Note   CSN: [id number] Arrival date & time: 03/31/18  0001     History   Chief Complaint Chief Complaint  Patient presents with  . Right hip pain    HPI Bruce Garcia is a 68 y.o. male.  Patient to ED with complaint of right hip fracture. The patient states he has had pain the hip since previous fall months ago. Per chart review, he was seen in August of last year in evaluation of fall with hip pain and the x-ray did not show any fractures. He adamantly denies new injury or recent fall. He has a history of HTN, HLD, DM, COPD, GERD and is followed by the [place]. No history of orthopedic injury.   The history is provided by the patient and the EMS personnel. No language interpreter was used.    Past Medical History:  Diagnosis Date  . COPD (chronic obstructive pulmonary disease) (HCC)   . Depression   . Diabetes mellitus without complication (HCC)   . GERD (gastroesophageal reflux disease)   . Hypercholesteremia   . Hypertension     There are no active problems to display for this patient.         Home Medications    Prior to Admission medications   Not on File    Family History No family history on file.  Social History Social History   Tobacco Use  . Smoking status: Not on file  Substance Use Topics  . Alcohol use: Not on file  . Drug use: Not on file     Allergies   Patient has no known allergies.   Review of Systems Review of Systems  Constitutional: Negative for fever.  Respiratory: Negative for shortness of breath.   Cardiovascular: Negative for chest pain.  Gastrointestinal: Negative for abdominal pain and vomiting.  Musculoskeletal:       See HPI.  Skin: Negative for color change and wound.  Neurological: Positive for weakness (He reports general weakness. ). Negative for numbness.     Physical Exam Updated Vital Signs SpO2 97%   Physical Exam Vitals signs and nursing note  reviewed.  Constitutional:      Appearance: He is well-developed.  HENT:     Head: Normocephalic.  Neck:     Musculoskeletal: Normal range of motion and neck supple.  Cardiovascular:     Rate and Rhythm: Normal rate and regular rhythm.  Pulmonary:     Effort: Pulmonary effort is normal. No respiratory distress.     Breath sounds: Normal breath sounds. No wheezing, rhonchi or rales.  Chest:     Chest wall: No tenderness.  Abdominal:     General: Bowel sounds are normal.     Palpations: Abdomen is soft.     Tenderness: There is no abdominal tenderness. There is no guarding or rebound.  Musculoskeletal:     Comments: Restricted ROM right LE. No shortening or rotation.   Skin:    General: Skin is warm and dry.     Findings: No rash.  Neurological:     Mental Status: He is alert.     Cranial Nerves: No cranial nerve deficit.      ED Treatments / Results  Labs (all labs ordered are listed, but only abnormal results are displayed) Labs Reviewed - No data to display  EKG None  Radiology No results found. Results for orders placed or performed during the hospital encounter of 03/31/18  CBC with Differential  Result  Value Ref Range   WBC 11.2 (H) 4.0 - 10.5 K/uL   RBC 3.06 (L) 4.22 - 5.81 MIL/uL   Hemoglobin 9.1 (L) 13.0 - 17.0 g/dL   HCT 82.9 (L) 93.7 - 16.9 %   MCV 97.4 80.0 - 100.0 fL   MCH 29.7 26.0 - 34.0 pg   MCHC 30.5 30.0 - 36.0 g/dL   RDW 67.8 93.8 - 10.1 %   Platelets 290 150 - 400 K/uL   nRBC 0.0 0.0 - 0.2 %   Neutrophils Relative % 62 %   Neutro Abs 6.9 1.7 - 7.7 K/uL   Lymphocytes Relative 23 %   Lymphs Abs 2.6 0.7 - 4.0 K/uL   Monocytes Relative 9 %   Monocytes Absolute 1.0 0.1 - 1.0 K/uL   Eosinophils Relative 5 %   Eosinophils Absolute 0.5 0.0 - 0.5 K/uL   Basophils Relative 0 %   Basophils Absolute 0.0 0.0 - 0.1 K/uL   Immature Granulocytes 1 %   Abs Immature Granulocytes 0.14 (H) 0.00 - 0.07 K/uL   Dg Chest Portable 1 View  Result Date:  03/31/2018 CLINICAL DATA:  Preop for hip surgery. Hypertension. COPD. Smoker. EXAM: PORTABLE CHEST 1 VIEW COMPARISON:  None. FINDINGS: Midline trachea. Normal heart size. Atherosclerosis in the transverse aorta. No pleural effusion or pneumothorax. Mild hyperinflation. Clear lungs. IMPRESSION: 1. Hyperinflation, without acute disease. 2. Aortic atherosclerosis. Electronically Signed   By: Jeronimo Greaves M.D.   On: 03/31/2018 02:08   Dg Hip Unilat  With Pelvis 2-3 Views Right  Result Date: 03/31/2018 CLINICAL DATA:  Hip pain EXAM: DG HIP (WITH OR WITHOUT PELVIS) 2-3V RIGHT COMPARISON:  10/29/2017 FINDINGS: Superiorly displaced, overriding fracture of the right femoral neck. Femoral head remains situated in the acetabulum. No other pelvic fracture. No pelvic diastasis. IMPRESSION: Superiorly displaced, overriding fracture of the right femoral neck. Electronically Signed   By: Deatra Robinson M.D.   On: 03/31/2018 01:05    Procedures Procedures (including critical care time)  Medications Ordered in ED Medications - No data to display   Initial Impression / Assessment and Plan / ED Course  I have reviewed the triage vital signs and the nursing notes.  Pertinent labs & imaging results that were available during my care of the patient were reviewed by me and considered in my medical decision making (see chart for details).     Patient to ED with complaint of hip pain for "months", found to have a hip fractured when x-rayed at facility earlier today. He denies recent injury or fall. No other complaints.   Imaging here confirms femoral neck fracture on right. Findings discussed with Dr. Magnus Ivan (ortho) who recommends CT hip to confirm acuity given no history of recent injury and ongoing pain for long duration. Will admit to hospitalist.  Discussed with [facility] who accepts for admission.  Final Clinical Impressions(s) / ED Diagnoses   Final diagnoses:  None   1. Right femoral neck fracture  ED  Discharge Orders    None       Elpidio Anis, PA-C 03/31/18 0231    Mesner, Barbara Cower, MD 03/31/18 [phone]

## 2018-04-01 LAB — CBC
HCT: 32.1 % — ABNORMAL LOW (ref 39.0–52.0)
Hemoglobin: 9.7 g/dL — ABNORMAL LOW (ref 13.0–17.0)
MCH: 29.8 pg (ref 26.0–34.0)
MCHC: 30.2 g/dL (ref 30.0–36.0)
MCV: 98.8 fL (ref 80.0–100.0)
PLATELETS: 231 10*3/uL (ref 150–400)
RBC: 3.25 MIL/uL — ABNORMAL LOW (ref 4.22–5.81)
RDW: 13.9 % (ref 11.5–15.5)
WBC: 8.2 10*3/uL (ref 4.0–10.5)
nRBC: 0 % (ref 0.0–0.2)

## 2018-04-01 LAB — BASIC METABOLIC PANEL
Anion gap: 7 (ref 5–15)
Anion gap: 7 (ref 5–15)
BUN: 39 mg/dL — ABNORMAL HIGH (ref 8–23)
BUN: 31 mg/dL — ABNORMAL HIGH (ref 8–23)
CO2: 25 mmol/L (ref 22–32)
CO2: 25 mmol/L (ref 22–32)
CREATININE: 1.88 mg/dL — AB (ref 0.61–1.24)
Calcium: 8.8 mg/dL — ABNORMAL LOW (ref 8.9–10.3)
Calcium: 8.6 mg/dL — ABNORMAL LOW (ref 8.9–10.3)
Chloride: 104 mmol/L (ref 98–111)
Chloride: 102 mmol/L (ref 98–111)
Creatinine, Ser: 1.56 mg/dL — ABNORMAL HIGH (ref 0.61–1.24)
GFR calc Af Amer: 42 mL/min — ABNORMAL LOW (ref >60)
GFR calc Af Amer: 53 mL/min — ABNORMAL LOW (ref >60)
GFR calc non Af Amer: 37 mL/min — ABNORMAL LOW (ref >60)
GFR calc non Af Amer: 46 mL/min — ABNORMAL LOW (ref >60)
Glucose, Bld: 100 mg/dL — ABNORMAL HIGH (ref 70–99)
Glucose, Bld: 122 mg/dL — ABNORMAL HIGH (ref 70–99)
POTASSIUM: 4.5 mmol/L (ref 3.5–5.1)
Potassium: 5.2 mmol/L — ABNORMAL HIGH (ref 3.5–5.1)
SODIUM: 136 mmol/L (ref 135–145)
Sodium: 134 mmol/L — ABNORMAL LOW (ref 135–145)

## 2018-04-01 LAB — URINE CULTURE

## 2018-04-01 LAB — HIV ANTIBODY (ROUTINE TESTING W REFLEX): HIV Screen 4th Generation wRfx: NONREACTIVE

## 2018-04-01 LAB — GLUCOSE, CAPILLARY
Glucose-Capillary: 73 mg/dL (ref 70–99)
Glucose-Capillary: 75 mg/dL (ref 70–99)
Glucose-Capillary: 118 mg/dL — ABNORMAL HIGH (ref 70–99)
Glucose-Capillary: 135 mg/dL — ABNORMAL HIGH (ref 70–99)

## 2018-04-01 MED ORDER — HYDRALAZINE HCL 20 MG/ML IJ SOLN
20.0000 mg | Freq: Four times a day (QID) | INTRAMUSCULAR | Status: DC | PRN
  Administered 2018-04-01 – 2018-04-02 (×3): 20 mg via INTRAVENOUS
  Filled 2018-04-01 (×3): qty 1

## 2018-04-01 MED ORDER — TRAZODONE HCL 100 MG PO TABS: 100.0000 mg | ORAL_TABLET | Freq: Every day | ORAL | 0 refills | Status: DC

## 2018-04-01 MED ORDER — AMLODIPINE BESYLATE 5 MG PO TABS
5.0000 mg | ORAL_TABLET | Freq: Once | ORAL | Status: AC
  Administered 2018-04-01: 5 mg via ORAL
  Filled 2018-04-01: qty 1

## 2018-04-01 MED ORDER — POLYETHYLENE GLYCOL 3350 17 G PO PACK: 17.0000 g | PACK | Freq: Two times a day (BID) | ORAL | 0 refills | Status: AC

## 2018-04-01 MED ORDER — CIPROFLOXACIN HCL 500 MG PO TABS: 500.0000 mg | ORAL_TABLET | Freq: Two times a day (BID) | ORAL | 0 refills | Status: DC

## 2018-04-01 MED ORDER — HYDROCODONE-ACETAMINOPHEN 5-325 MG PO TABS: 1.0000 | ORAL_TABLET | Freq: Four times a day (QID) | ORAL | 0 refills | Status: DC | PRN

## 2018-04-01 MED ORDER — AMLODIPINE BESYLATE 5 MG PO TABS: 5.0000 mg | ORAL_TABLET | Freq: Once | ORAL | 0 refills | Status: DC

## 2018-04-01 MED ORDER — HYDRALAZINE HCL 20 MG/ML IJ SOLN
10.0000 mg | Freq: Four times a day (QID) | INTRAMUSCULAR | Status: DC | PRN
  Administered 2018-04-01: 10 mg via INTRAVENOUS
  Filled 2018-04-01: qty 1

## 2018-04-01 MED ORDER — AMLODIPINE BESYLATE 5 MG PO TABS
2.5000 mg | ORAL_TABLET | Freq: Every day | ORAL | Status: DC
  Administered 2018-04-01: 2.5 mg via ORAL
  Filled 2018-04-01: qty 1

## 2018-04-01 MED ORDER — SODIUM ZIRCONIUM CYCLOSILICATE 5 G PO PACK
5.0000 g | PACK | Freq: Once | ORAL | Status: AC
  Administered 2018-04-01: 5 g via ORAL
  Filled 2018-04-01: qty 1

## 2018-04-01 MED ORDER — GABAPENTIN 300 MG PO CAPS: 600.0000 mg | ORAL_CAPSULE | Freq: Two times a day (BID) | ORAL | 0 refills | Status: AC

## 2018-04-01 MED ORDER — BISACODYL 10 MG RE SUPP
10.0000 mg | Freq: Once | RECTAL | Status: AC
  Administered 2018-04-01: 10 mg via RECTAL
  Filled 2018-04-01: qty 1

## 2018-04-01 MED ORDER — CIPROFLOXACIN HCL 500 MG PO TABS
500.0000 mg | ORAL_TABLET | Freq: Two times a day (BID) | ORAL | Status: DC
  Administered 2018-04-01 – 2018-04-02 (×3): 500 mg via ORAL
  Filled 2018-04-01 (×3): qty 1

## 2018-04-01 MED ORDER — AMLODIPINE BESYLATE 2.5 MG PO TABS: 2.5000 mg | ORAL_TABLET | Freq: Every day | ORAL | 0 refills | Status: DC

## 2018-04-01 MED ORDER — TAMSULOSIN HCL 0.4 MG PO CAPS: 0.4000 mg | ORAL_CAPSULE | Freq: Every day | ORAL | 0 refills | Status: AC

## 2018-04-01 MED ORDER — SENNOSIDES-DOCUSATE SODIUM 8.6-50 MG PO TABS: 1.0000 | ORAL_TABLET | Freq: Two times a day (BID) | ORAL | 0 refills | Status: AC

## 2018-04-01 NOTE — Progress Notes (Signed)
Patient ID: Bruce Garcia, male   DOB: [birth year], 68 y.o.   MRN: [id number] No acute changes.  The patient is at his baseline in terms of the right hip pain that he was been experiencing for several months now.  Again, the right hip fracture appears subacute.  He understands fully that I am recommending a right total hip replacement.  We will set this up during our regular OR schedule in the next 1-2 weeks.  He can go back to [facility] in the interim.  He needs to remain non-wright bearing on the right hip until then.

## 2018-04-01 NOTE — Progress Notes (Signed)
[name] aware of patient's discharge orders. They are unable to accept on Sunday if summary is not received before noon. Patient should be able to discharge Monday morning.   Enid Cutter, MSW, LCSWA Clinical Social Work [phone]

## 2018-04-01 NOTE — Progress Notes (Signed)
Pt was able to have a large formed/soft BM today. MD notified.

## 2018-04-01 NOTE — Progress Notes (Addendum)
At 1233, Bruce Nielsen, MD was notified regarding the pt's BP of 165/69 prior to Hydralazine and 193/69 an hour after the PRN med. I will continue to monitor the pt.  Regalado, MD returned my page and ordered Norvasc 5 mg tablet PO once.   The pt's BP is now 154/62.

## 2018-04-01 NOTE — Progress Notes (Signed)
Pharmacy Antibiotic Note  Bruce Garcia is a 68 y.o. male admitted on 03/31/2018 with dehydration, possible UTI. Marland Kitchen  Pharmacy has been consulted for Cipro dosing.  Plan: Okay to change to PO per MD. Change to Cipro 500mg  PO BID (separate by at least 2 hours from iron, MVI).  Monitor renal function, cultures, clinical course  Height: 5\' 8"  (172.7 cm) Weight: 156 lb (70.8 kg) IBW/kg (Calculated) : 68.4  Temp (24hrs), Avg:97.8 F (36.6 C), Min:97.5 F (36.4 C), Max:98 F (36.7 C)  Recent Labs  Lab 03/31/18 0200 04/01/18 0334  WBC 11.2* 8.2  CREATININE 2.41* 1.88*    Estimated Creatinine Clearance: 37.9 mL/min (A) (by C-G formula based on SCr of 1.88 mg/dL (H)).    Allergies  Allergen Reactions  . Chicken Allergy Itching, Nausea And Vomiting and Swelling    Patient reports allergy to chicken.   . Flu Virus Vaccine Nausea And Vomiting  . Penicillins Anaphylaxis    Did it involve swelling of the face/tongue/throat, SOB, or low BP? Y Did it involve sudden or severe rash/hives, skin peeling, or any reaction on the inside of your mouth or nose? Y Did you need to seek medical attention at a hospital or doctor's office? UNK When did it last happen?childhood If all above answers are "NO", may proceed with cephalosporin use.   . Pneumococcal Vaccine Nausea And Vomiting    Antimicrobials this admission: 1/18 Cipro >>  Microbiology results: 1/18 UCx: sent   1/18 MRSA PCR: positive  Thank you for allowing pharmacy to be a part of this patient's care.   Greer Pickerel, PharmD, BCPS Pager: [phone] 04/01/2018 12:15 PM

## 2018-04-01 NOTE — Discharge Summary (Signed)
Physician Discharge Summary  Bruce Garcia YWV:[id number] DOB: [birth year] DOA: 03/31/2018  PCP: [name], MD  Admit date: 03/31/2018 Discharge date: 04/01/2018  Admitted From: SNF Disposition: SNF  Recommendations for Outpatient Follow-up:  1. Follow up with PCP in 1-2 weeks 2. Please obtain BMP/CBC in one week 3. Needs to follow up with Dr [name] for hip replacement.  4. Needs to monitor BP. Adjust medication as needed.  5. Follow urine culture.     Discharge Condition: stable.  CODE STATUS: full code.  Diet recommendation: Heart Healthy  Brief/Interim Summary: 6-year-old with past medical history significant for COPD, depression, diabetes type 2, hypertension who presented from nursing home for evaluation of right hip pain.  Patient has been having painful right hip pain for the past 3 months.  Evaluation in the ED show, right hip fracture.  Elevated by Dr. [name] fracture is old plan to schedule surgery in 1 or 2 weeks.  1-Dehydration; continue with IV fluids overnight. resolved.   2-Constipation; started MiraLAX, Senokot.  Dulcolax suppository. he has had BM.   3-Hip fracture; Dr. [name] planning to arrange surgery as an outpatient. opioid PRN for p[ain.   4-chronic kidney disease, stage; stage III Creatinine at 2.4, 46-monthago. Treated with IV fluids.  Stop fluids. Renal function stable.  Hyperkalemia; mild;y elevated. Will give dose of lokelma and repeat B-met this afternoon. If hyperkalemia resolved plan to discharge patient today.    5-Diabetes: Continue with a sliding scale.  6-COPD continue with home inhalers.  7-Leukocytosis; UA with more than 50 white blood cell.  Urine culture pending. Treating with cipro for 3 days.   8-HTN; Blood pressure elevated.  Started Norvasc daily.  PRN hydralazine.   Discharge Diagnoses:  Principal Problem:   Hip fracture ([place] Active Problems:   Leukocytosis   Anemia   Renal insufficiency   HTN  (hypertension)   COPD (chronic obstructive pulmonary disease) (HCC)   Type 2 diabetes mellitus (HCC)   Abdominal tenderness   Anxiety    Discharge Instructions   Allergies as of 04/01/2018      Reactions   Chicken Allergy Itching, Nausea And Vomiting, Swelling   Patient reports allergy to chicken.   Flu Virus Vaccine Nausea And Vomiting   Penicillins Anaphylaxis   Did it involve swelling of the face/tongue/throat, SOB, or low BP? Y Did it involve sudden or severe rash/hives, skin peeling, or any reaction on the inside of your mouth or nose? Y Did you need to seek medical attention at a hospital or doctor's office? UNK When did it last happen?childhood If all above answers are "NO", may proceed with cephalosporin use.   Pneumococcal Vaccine Nausea And Vomiting      Medication List    TAKE these medications   albuterol 108 (90 Base) MCG/ACT inhaler Commonly known as:  PROVENTIL HFA;VENTOLIN HFA Inhale 2 puffs into the lungs every 6 (six) hours as needed for wheezing or shortness of breath.   albuterol (2.5 MG/3ML) 0.083% nebulizer solution Commonly known as:  PROVENTIL Take 2.5 mg by nebulization every 6 (six) hours as needed for wheezing or shortness of breath.   amLODipine 5 MG tablet Commonly known as:  NORVASC Take 1 tablet (5 mg total) by mouth once for 1 dose.   ciprofloxacin 500 MG tablet Commonly known as:  CIPRO Take 1 tablet (500 mg total) by mouth 2 (two) times daily.   clonazePAM 0.5 MG tablet Commonly known as:  KLONOPIN Take 0.25 mg by mouth 3 (three) times  daily as needed for anxiety (nerves).   DULoxetine 60 MG capsule Commonly known as:  CYMBALTA Take 60 mg by mouth every morning.   ferrous gluconate 324 MG tablet Commonly known as:  FERGON Take 324 mg by mouth daily with breakfast.   fluticasone 50 MCG/ACT nasal spray Commonly known as:  FLONASE Place 2 sprays into both nostrils daily.   gabapentin 300 MG capsule Commonly known as:   NEURONTIN Take 2 capsules (600 mg total) by mouth 2 (two) times daily. What changed:  when to take this   HYDROcodone-acetaminophen 5-325 MG tablet Commonly known as:  NORCO/VICODIN Take 1-2 tablets by mouth every 6 (six) hours as needed for moderate pain.   lidocaine 5 % Commonly known as:  LIDODERM Place 1 patch onto the skin daily. Remove & Discard patch within 12 hours or as directed by MD   Melatonin 3 MG Tabs Take 2 tablets by mouth at bedtime as needed (sleep).   multivitamin with minerals Tabs tablet Take 1 tablet by mouth daily.   naloxone 4 MG/0.1ML Liqd nasal spray kit Commonly known as:  NARCAN Place 1 spray into the nose once.   omega-3 acid ethyl esters 1 g capsule Commonly known as:  LOVAZA Take 2 g by mouth 3 (three) times daily.   polyethylene glycol packet Commonly known as:  MIRALAX / GLYCOLAX Take 17 g by mouth 2 (two) times daily.   senna-docusate 8.6-50 MG tablet Commonly known as:  Senokot-S Take 1 tablet by mouth 2 (two) times daily.   tamsulosin 0.4 MG Caps capsule Commonly known as:  FLOMAX Take 1 capsule (0.4 mg total) by mouth daily. Start taking on:  April 02, 2018   tiotropium 18 MCG inhalation capsule Commonly known as:  SPIRIVA Place 18 mcg into inhaler and inhale daily.   traZODone 100 MG tablet Commonly known as:  DESYREL Take 1 tablet (100 mg total) by mouth at bedtime. What changed:  how much to take      Follow-up Information    Mcarthur Rossetti, MD. Schedule an appointment as soon as possible for a visit in 1 week(s).   Specialty:  Orthopedic Surgery Contact information: [address] [place] [address] [phone]          Allergies  Allergen Reactions  . Chicken Allergy Itching, Nausea And Vomiting and Swelling    Patient reports allergy to chicken.   . Flu Virus Vaccine Nausea And Vomiting  . Penicillins Anaphylaxis    Did it involve swelling of the face/tongue/throat, SOB, or low BP?  Y Did it involve sudden or severe rash/hives, skin peeling, or any reaction on the inside of your mouth or nose? Y Did you need to seek medical attention at a hospital or doctor's office? UNK When did it last happen?childhood If all above answers are "NO", may proceed with cephalosporin use.   . Pneumococcal Vaccine Nausea And Vomiting    Consultations:  Dr [name]   Procedures/Studies: Ct Hip Right Wo Contrast  Result Date: 03/31/2018 CLINICAL DATA:  68 year old male with right femoral neck fracture. EXAM: CT OF THE RIGHT HIP WITHOUT CONTRAST TECHNIQUE: Multidetector CT imaging of the right hip was performed according to the standard protocol. Multiplanar CT image reconstructions were also generated. COMPARISON:  Right hip radiograph dated 03/31/2018 and 10/29/2017 FINDINGS: Bones/Joint/Cartilage There is an impacted transverse fracture of the right femoral neck. There is no dislocation. There is advanced osteopenia. Ligaments Suboptimally assessed by CT. Muscles and Tendons No acute findings.  Soft tissues No acute findings. IMPRESSION: Impacted transverse fracture of the right femoral neck. Electronically Signed   By: Anner Crete M.D.   On: 03/31/2018 03:01   Dg Chest Portable 1 View  Result Date: 03/31/2018 CLINICAL DATA:  Preop for hip surgery. Hypertension. COPD. Smoker. EXAM: PORTABLE CHEST 1 VIEW COMPARISON:  None. FINDINGS: Midline trachea. Normal heart size. Atherosclerosis in the transverse aorta. No pleural effusion or pneumothorax. Mild hyperinflation. Clear lungs. IMPRESSION: 1. Hyperinflation, without acute disease. 2. Aortic atherosclerosis. Electronically Signed   By: Abigail Miyamoto M.D.   On: 03/31/2018 02:08   Dg Abd Portable 1v  Result Date: 03/31/2018 CLINICAL DATA:  Abdominal pain EXAM: PORTABLE ABDOMEN - 1 VIEW COMPARISON:  None. FINDINGS: No dilated small bowel loops. Moderate stool throughout the large bowel. Mild gaseous distention of the stomach. No  evidence of pneumatosis or pneumoperitoneum. Clear lung bases. No radiopaque nephrolithiasis. Impacted right femoral neck fracture is partially visualized. IMPRESSION: Nonobstructive bowel gas pattern. Moderate colonic stool volume, suggesting constipation. Partially visualized impacted right femoral neck fracture. Electronically Signed   By: Ilona Sorrel M.D.   On: 03/31/2018 06:51   Dg Hip Unilat  With Pelvis 2-3 Views Right  Result Date: 03/31/2018 CLINICAL DATA:  Hip pain EXAM: DG HIP (WITH OR WITHOUT PELVIS) 2-3V RIGHT COMPARISON:  10/29/2017 FINDINGS: Superiorly displaced, overriding fracture of the right femoral neck. Femoral head remains situated in the acetabulum. No other pelvic fracture. No pelvic diastasis. IMPRESSION: Superiorly displaced, overriding fracture of the right femoral neck. Electronically Signed   By: Ulyses Jarred M.D.   On: 03/31/2018 01:05   Subjective:  Had BM No new complaint.   Discharge Exam: Vitals:   04/01/18 1053 04/01/18 1220  BP: (!) 165/69 (!) 193/91  Pulse: 68 77  Resp:    Temp:    SpO2:     Vitals:   04/01/18 0753 04/01/18 1040 04/01/18 1053 04/01/18 1220  BP: (!) 146/98 (!) 208/86 (!) 165/69 (!) 193/91  Pulse: 78 79 68 77  Resp:  18    Temp:  (!) 97.5 F (36.4 C)    TempSrc:  Oral    SpO2:  99%    Weight:      Height:        General: Pt is alert, awake, not in acute distress Cardiovascular: RRR, S1/S2 +, no rubs, no gallops Respiratory: CTA bilaterally, no wheezing, no rhonchi Abdominal: Soft, NT, ND, bowel sounds + Extremities: no edema, no cyanosis    The results of significant diagnostics from this hospitalization (including imaging, microbiology, ancillary and laboratory) are listed below for reference.     Microbiology: Recent Results (from the past 240 hour(s))  MRSA PCR Screening     Status: Abnormal   Collection Time: 03/31/18  4:32 AM  Result Value Ref Range Status   MRSA by PCR POSITIVE (A) NEGATIVE Final    Comment:         The GeneXpert MRSA Assay (FDA approved for NASAL specimens only), is one component of a comprehensive MRSA colonization surveillance program. It is not intended to diagnose MRSA infection nor to guide or monitor treatment for MRSA infections. RESULT CALLED TO, READ BACK BY AND VERIFIED WITH: GREEN,T. RN AT 1100 03/31/18 MULLINS,T Performed at [facility], [place] [address], [place], [place] [address]      Labs: BNP (last 3 results) No results for input(s): BNP in the last 8760 hours. Basic Metabolic Panel: Recent Labs  Lab 03/31/18 0200 04/01/18 0334  NA 139 136  K 4.4 5.2*  CL 103 104  CO2 26 25  GLUCOSE 120* 100*  BUN 45* 39*  CREATININE 2.41* 1.88*  CALCIUM 8.8* 8.8*   Liver Function Tests: Recent Labs  Lab 03/31/18 0200  AST 21  ALT 23  ALKPHOS 97  BILITOT 0.6  PROT 6.9  ALBUMIN 3.3*   No results for input(s): LIPASE, AMYLASE in the last 168 hours. No results for input(s): AMMONIA in the last 168 hours. CBC: Recent Labs  Lab 03/31/18 0200 04/01/18 0334  WBC 11.2* 8.2  NEUTROABS 6.9  --   HGB 9.1* 9.7*  HCT 29.8* 32.1*  MCV 97.4 98.8  PLT 290 231   Cardiac Enzymes: No results for input(s): CKTOTAL, CKMB, CKMBINDEX, TROPONINI in the last 168 hours. BNP: Invalid input(s): POCBNP CBG: Recent Labs  Lab 03/31/18 1219 03/31/18 1559 03/31/18 2127 04/01/18 0735 04/01/18 1217  GLUCAP 104* 125* 114* 73 75   D-Dimer No results for input(s): DDIMER in the last 72 hours. Hgb A1c No results for input(s): HGBA1C in the last 72 hours. Lipid Profile No results for input(s): CHOL, HDL, LDLCALC, TRIG, CHOLHDL, LDLDIRECT in the last 72 hours. Thyroid function studies No results for input(s): TSH, T4TOTAL, T3FREE, THYROIDAB in the last 72 hours.  Invalid input(s): FREET3 Anemia work up No results for input(s): VITAMINB12, FOLATE, FERRITIN, TIBC, IRON, RETICCTPCT in the last 72 hours. Urinalysis    Component Value Date/Time    COLORURINE YELLOW 03/31/2018 0617   APPEARANCEUR HAZY (A) 03/31/2018 0617   LABSPEC 1.012 03/31/2018 0617   PHURINE 6.0 03/31/2018 0617   GLUCOSEU NEGATIVE 03/31/2018 0617   HGBUR SMALL (A) 03/31/2018 0617   BILIRUBINUR NEGATIVE 03/31/2018 0617   KETONESUR NEGATIVE 03/31/2018 0617   PROTEINUR 30 (A) 03/31/2018 0617   NITRITE NEGATIVE 03/31/2018 0617   LEUKOCYTESUR LARGE (A) 03/31/2018 0617   Sepsis Labs Invalid input(s): PROCALCITONIN,  WBC,  LACTICIDVEN Microbiology Recent Results (from the past 240 hour(s))  MRSA PCR Screening     Status: Abnormal   Collection Time: 03/31/18  4:32 AM  Result Value Ref Range Status   MRSA by PCR POSITIVE (A) NEGATIVE Final    Comment:        The GeneXpert MRSA Assay (FDA approved for NASAL specimens only), is one component of a comprehensive MRSA colonization surveillance program. It is not intended to diagnose MRSA infection nor to guide or monitor treatment for MRSA infections. RESULT CALLED TO, READ BACK BY AND VERIFIED WITH: GREEN,T. RN AT 1100 03/31/18 MULLINS,T Performed at [facility], [place] [address], [place], [place] [address]      Time coordinating discharge: 35 minutes.   SIGNED:   Elmarie Shiley, MD  [facility] 04/01/2018, 1:05 PM    If 7PM-7AM, please contact night-coverage [email or web address] Password [email or web address]

## 2018-04-01 NOTE — Plan of Care (Signed)

## 2018-04-01 NOTE — Progress Notes (Signed)
At 0741, Sunnie Nielsenegalado, MD was paged regarding the pt's BP of 182/71.

## 2018-04-01 NOTE — Discharge Instructions (Signed)
No weight bearing on his right hip at all. We will schedule him for a right total hip replacement in the near future.

## 2018-04-01 NOTE — Progress Notes (Signed)
At [phone], the social worker was called and a voicemail was left regarding the pt's possible d/c back to [facility] depending on his BMET results, which will be drawn at 1300 per MD.

## 2018-04-01 NOTE — Progress Notes (Signed)
Pt's BP was 175/70 at 1608. Pt was given PRN Hydralazine at 1613. The pt's BP is now 159/65. Regalado, MD was notified at 39 and returned my page at 1705. The MD advised to continue to monitor the BP.

## 2018-04-02 LAB — GLUCOSE, CAPILLARY: Glucose-Capillary: 119 mg/dL — ABNORMAL HIGH (ref 70–99)

## 2018-04-02 LAB — CREATININE, SERUM
Creatinine, Ser: 1.77 mg/dL — ABNORMAL HIGH (ref 0.61–1.24)
GFR calc Af Amer: 46 mL/min — ABNORMAL LOW (ref >60)
GFR calc non Af Amer: 39 mL/min — ABNORMAL LOW (ref >60)

## 2018-04-02 MED ORDER — AMLODIPINE BESYLATE 10 MG PO TABS: 10.0000 mg | ORAL_TABLET | Freq: Every day | ORAL | 0 refills | Status: AC

## 2018-04-02 MED ORDER — HYDRALAZINE HCL 25 MG PO TABS
25.0000 mg | ORAL_TABLET | Freq: Two times a day (BID) | ORAL | Status: DC
  Administered 2018-04-02: 25 mg via ORAL
  Filled 2018-04-02: qty 1

## 2018-04-02 MED ORDER — HYDRALAZINE HCL 25 MG PO TABS: 25.0000 mg | ORAL_TABLET | Freq: Two times a day (BID) | ORAL | 0 refills | Status: AC

## 2018-04-02 MED ORDER — CIPROFLOXACIN HCL 0.3 % OP SOLN
1.0000 [drp] | OPHTHALMIC | Status: DC
  Administered 2018-04-02: 1 [drp] via OPHTHALMIC
  Filled 2018-04-02: qty 2.5

## 2018-04-02 MED ORDER — AMLODIPINE BESYLATE 10 MG PO TABS
10.0000 mg | ORAL_TABLET | Freq: Every day | ORAL | Status: DC
  Administered 2018-04-02: 10 mg via ORAL
  Filled 2018-04-02: qty 1

## 2018-04-02 MED ORDER — CIPROFLOXACIN HCL 0.3 % OP SOLN: 1.0000 [drp] | OPHTHALMIC | 0 refills | Status: DC

## 2018-04-02 NOTE — [place] FL2 (Signed)
[place] MEDICAID FL2 LEVEL OF CARE SCREENING TOOL     IDENTIFICATION  Patient Name: Bruce Garcia Birthdate: [birth year] Sex: male Admission Date (Current Location): 03/31/2018  [facility] [place] Number:  Producer, television/film/video and Address:  [facility],  [address], [place] [address]      Provider Number: [id number]  Attending Physician Name and Address:  Alba Cory, MD  Relative Name and Phone Number:  Johnette Abraham: [phone]    Current Level of Care: Hospital Recommended Level of Care: Skilled Nursing Facility Prior Approval Number:    Date Approved/Denied:   PASRR Number:    Discharge Plan: SNF    Current Diagnoses: Patient Active Problem List   Diagnosis Date Noted  . Hip fracture (HCC) 03/31/2018  . Leukocytosis 03/31/2018  . Anemia 03/31/2018  . Renal insufficiency 03/31/2018  . HTN (hypertension) 03/31/2018  . COPD (chronic obstructive pulmonary disease) (HCC) 03/31/2018  . Type 2 diabetes mellitus (HCC) 03/31/2018  . Abdominal tenderness 03/31/2018  . Anxiety 03/31/2018    Orientation RESPIRATION BLADDER Height & Weight     Self, Time, Situation, Place  Normal Continent Weight: 156 lb (70.8 kg) Height:  5\' 8"  (172.7 cm)  BEHAVIORAL SYMPTOMS/MOOD NEUROLOGICAL BOWEL NUTRITION STATUS      Continent Diet(Low Sodium Heart Healthy )  AMBULATORY STATUS COMMUNICATION OF NEEDS Skin   Extensive Assist Verbally Surgical wounds                       Personal Care Assistance Level of Assistance  Bathing, Feeding, Dressing Bathing Assistance: Limited assistance Feeding assistance: Independent Dressing Assistance: Maximum assistance     Functional Limitations Info  Sight, Hearing, Speech Sight Info: Adequate Hearing Info: Adequate Speech Info: Adequate    SPECIAL CARE FACTORS FREQUENCY  PT (By licensed PT), OT (By licensed OT)     PT Frequency: 5x/week OT Frequency: 5x/week            Contractures Contractures  Info: Not present    Additional Factors Info  Code Status, Allergies, Psychotropic, Insulin Sliding Scale Code Status Info: Fullcode  Allergies Info: Allergies: Chicken Allergy, Flu Virus Vaccine, Penicillins, Pneumococcal Vaccine   Insulin Sliding Scale Info: See discharge Summary.        Current Medications (04/02/2018):  This is the current hospital active medication list Current Facility-Administered Medications  Medication Dose Route Frequency Provider Last Rate Last Dose  . albuterol (PROVENTIL) (2.5 MG/3ML) 0.083% nebulizer solution 2.5 mg  2.5 mg Nebulization Q6H PRN John Giovanni, MD      . amLODipine (NORVASC) tablet 10 mg  10 mg Oral Daily Regalado, Belkys A, MD      . ciprofloxacin (CILOXAN) 0.3 % ophthalmic solution 1 drop  1 drop Left Eye Q4H while awake Regalado, Belkys A, MD      . ciprofloxacin (CIPRO) tablet 500 mg  500 mg Oral BID Regalado, Belkys A, MD   500 mg at 04/02/18 0836  . clonazePAM (KLONOPIN) tablet 0.25 mg  0.25 mg Oral TID PRN John Giovanni, MD      . ferrous gluconate (FERGON) tablet 324 mg  324 mg Oral Q breakfast John Giovanni, MD   324 mg at 04/01/18 0813  . fluticasone (FLONASE) 50 MCG/ACT nasal spray 2 spray  2 spray Each Nare Daily John Giovanni, MD   2 spray at 04/01/18 0813  . gabapentin (NEURONTIN) capsule 600 mg  600 mg Oral TID John Giovanni, MD   600 mg at  04/01/18 2212  . hydrALAZINE (APRESOLINE) injection 20 mg  20 mg Intravenous Q6H PRN Regalado, Belkys A, MD   20 mg at 04/02/18 0523  . hydrALAZINE (APRESOLINE) tablet 25 mg  25 mg Oral BID Regalado, Belkys A, MD      . HYDROcodone-acetaminophen (NORCO/VICODIN) 5-325 MG per tablet 1-2 tablet  1-2 tablet Oral Q6H PRN John Giovanni, MD   1 tablet at 04/02/18 0839  . insulin aspart (novoLOG) injection 0-9 Units  0-9 Units Subcutaneous TID WC John Giovanni, MD   1 Units at 03/31/18 1605  . lip balm (CARMEX) ointment   Topical PRN John Giovanni, MD      .  morphine 2 MG/ML injection 0.5 mg  0.5 mg Intravenous Q2H PRN John Giovanni, MD      . multivitamin with minerals tablet 1 tablet  1 tablet Oral Daily John Giovanni, MD   1 tablet at 04/01/18 [phone]  . omega-3 acid ethyl esters (LOVAZA) capsule 2 g  2 g Oral TID John Giovanni, MD   2 g at 04/01/18 2212  . polyethylene glycol (MIRALAX / GLYCOLAX) packet 17 g  17 g Oral BID Regalado, Belkys A, MD   17 g at 04/01/18 0810  . senna-docusate (Senokot-S) tablet 1 tablet  1 tablet Oral BID Regalado, Belkys A, MD   1 tablet at 04/01/18 2212  . tamsulosin (FLOMAX) capsule 0.4 mg  0.4 mg Oral Daily Regalado, Belkys A, MD   0.4 mg at 04/01/18 0814  . umeclidinium bromide (INCRUSE ELLIPTA) 62.5 MCG/INH 1 puff  1 puff Inhalation Daily John Giovanni, MD   1 puff at 04/01/18 0815     Discharge Medications: Please see discharge summary for a list of discharge medications.  Relevant Imaging Results:  Relevant Lab Results:   Additional Information ssn:  Clearance Coots, LCSW

## 2018-04-02 NOTE — Progress Notes (Signed)
PHARMACY NOTE -  Ciprofloxacin  Pharmacy has been assisting with dosing of Cipro for UTI.  Dosage remains stable at 500 mg PO bid and need for further dosage adjustment appears unlikely at present  Pharmacy will sign off, following peripherally for culture results or dose adjustments. Please reconsult if a change in clinical status warrants re-evaluation of dosage.  Bernadene Personrew Ilyssa Grennan, PharmD, BCPS [phone] 04/02/2018, 11:12 AM

## 2018-04-02 NOTE — Discharge Summary (Signed)
Physician Discharge Summary  Bruce Garcia KXF:[id number] DOB: 12/12/1950 DOA: 03/31/2018  PCP: [name], MD  Admit date: 03/31/2018 Discharge date: 04/02/2018  Admitted From: SNF Disposition: SNF  Recommendations for Outpatient Follow-up:  1. Follow up with PCP in 1-2 weeks 2. Please obtain BMP/CBC in one week 3. Needs to follow up with Dr [name] for hip replacement.  4. Needs to monitor BP. Adjust medication as needed.  5. Follow urine culture.     Discharge Condition: stable.  CODE STATUS: full code.  Diet recommendation: Heart Healthy  Brief/Interim Summary: 68-year-old with past medical history significant for COPD, depression, diabetes type 2, hypertension who presented from nursing home for evaluation of right hip pain.  Patient has been having painful right hip pain for the past 3 months.  Evaluation in the ED show, right hip fracture.  Elevated by Dr. [name] fracture is old plan to schedule surgery in 1 or 2 weeks.  1-Dehydration; continue with IV fluids overnight. resolved.   2-Constipation; started MiraLAX, Senokot.  Dulcolax suppository. he has had BM.   3-Hip fracture; Dr. [name] planning to arrange surgery as an outpatient. opioid PRN for p[ain.   4-chronic kidney disease, stage; stage III Creatinine at 2.4, 80-monthago. Treated with IV fluids.  Stop fluids. Renal function stable.  Hyperkalemia; mild;y elevated. Received dose lokelma. K decreased to 4.    5-Diabetes: Continue with a sliding scale.  6-COPD continue with home inhalers.  7-Leukocytosis; UA with more than 50 white blood cell.  Urine culture pending. Treating with cipro for 3 days.   8-HTN; Blood pressure elevated.  Discharge on norvasc 10 mg and hydralazine 25 mg BID>     Discharge Diagnoses:  Principal Problem:   Hip fracture ([place] Active Problems:   Leukocytosis   Anemia   Renal insufficiency   HTN (hypertension)   COPD (chronic obstructive pulmonary disease)  (HCC)   Type 2 diabetes mellitus (HCC)   Abdominal tenderness   Anxiety    Discharge Instructions  Discharge Instructions    Diet - low sodium heart healthy   Complete by:  As directed    Diet - low sodium heart healthy   Complete by:  As directed    Increase activity slowly   Complete by:  As directed    Increase activity slowly   Complete by:  As directed      Allergies as of 04/02/2018      Reactions   Chicken Allergy Itching, Nausea And Vomiting, Swelling   Patient reports allergy to chicken.   Flu Virus Vaccine Nausea And Vomiting   Penicillins Anaphylaxis   Did it involve swelling of the face/tongue/throat, SOB, or low BP? Y Did it involve sudden or severe rash/hives, skin peeling, or any reaction on the inside of your mouth or nose? Y Did you need to seek medical attention at a hospital or doctor's office? UNK When did it last happen?childhood If all above answers are "NO", may proceed with cephalosporin use.   Pneumococcal Vaccine Nausea And Vomiting      Medication List    TAKE these medications   albuterol 108 (90 Base) MCG/ACT inhaler Commonly known as:  PROVENTIL HFA;VENTOLIN HFA Inhale 2 puffs into the lungs every 6 (six) hours as needed for wheezing or shortness of breath.   albuterol (2.5 MG/3ML) 0.083% nebulizer solution Commonly known as:  PROVENTIL Take 2.5 mg by nebulization every 6 (six) hours as needed for wheezing or shortness of breath.   amLODipine 10 MG tablet  Commonly known as:  NORVASC Take 1 tablet (10 mg total) by mouth daily.   ciprofloxacin 0.3 % ophthalmic solution Commonly known as:  CILOXAN Place 1 drop into the left eye every 4 (four) hours while awake. Administer 1 drop, every 2 hours, while awake, for 2 days. Then 1 drop, every 4 hours, while awake, for the next 5 days.   ciprofloxacin 500 MG tablet Commonly known as:  CIPRO Take 1 tablet (500 mg total) by mouth 2 (two) times daily.   clonazePAM 0.5 MG tablet Commonly  known as:  KLONOPIN Take 0.25 mg by mouth 3 (three) times daily as needed for anxiety (nerves).   DULoxetine 60 MG capsule Commonly known as:  CYMBALTA Take 60 mg by mouth every morning.   ferrous gluconate 324 MG tablet Commonly known as:  FERGON Take 324 mg by mouth daily with breakfast.   fluticasone 50 MCG/ACT nasal spray Commonly known as:  FLONASE Place 2 sprays into both nostrils daily.   gabapentin 300 MG capsule Commonly known as:  NEURONTIN Take 2 capsules (600 mg total) by mouth 2 (two) times daily. What changed:  when to take this   hydrALAZINE 25 MG tablet Commonly known as:  APRESOLINE Take 1 tablet (25 mg total) by mouth 2 (two) times daily.   HYDROcodone-acetaminophen 5-325 MG tablet Commonly known as:  NORCO/VICODIN Take 1-2 tablets by mouth every 6 (six) hours as needed for moderate pain.   lidocaine 5 % Commonly known as:  LIDODERM Place 1 patch onto the skin daily. Remove & Discard patch within 12 hours or as directed by MD   Melatonin 3 MG Tabs Take 2 tablets by mouth at bedtime as needed (sleep).   multivitamin with minerals Tabs tablet Take 1 tablet by mouth daily.   naloxone 4 MG/0.1ML Liqd nasal spray kit Commonly known as:  NARCAN Place 1 spray into the nose once.   omega-3 acid ethyl esters 1 g capsule Commonly known as:  LOVAZA Take 2 g by mouth 3 (three) times daily.   polyethylene glycol packet Commonly known as:  MIRALAX / GLYCOLAX Take 17 g by mouth 2 (two) times daily.   senna-docusate 8.6-50 MG tablet Commonly known as:  Senokot-S Take 1 tablet by mouth 2 (two) times daily.   tamsulosin 0.4 MG Caps capsule Commonly known as:  FLOMAX Take 1 capsule (0.4 mg total) by mouth daily.   tiotropium 18 MCG inhalation capsule Commonly known as:  SPIRIVA Place 18 mcg into inhaler and inhale daily.   traZODone 100 MG tablet Commonly known as:  DESYREL Take 1 tablet (100 mg total) by mouth at bedtime. What changed:  how much to  take      Follow-up Information    Mcarthur Rossetti, MD. Schedule an appointment as soon as possible for a visit in 1 week(s).   Specialty:  Orthopedic Surgery Contact information: [address] [place] [address] [phone]          Allergies  Allergen Reactions  . Chicken Allergy Itching, Nausea And Vomiting and Swelling    Patient reports allergy to chicken.   . Flu Virus Vaccine Nausea And Vomiting  . Penicillins Anaphylaxis    Did it involve swelling of the face/tongue/throat, SOB, or low BP? Y Did it involve sudden or severe rash/hives, skin peeling, or any reaction on the inside of your mouth or nose? Y Did you need to seek medical attention at a hospital or doctor's office? UNK When did it  last happen?childhood If all above answers are "NO", may proceed with cephalosporin use.   . Pneumococcal Vaccine Nausea And Vomiting    Consultations:  Dr [name]   Procedures/Studies: Ct Hip Right Wo Contrast  Result Date: 03/31/2018 CLINICAL DATA:  68 year old male with right femoral neck fracture. EXAM: CT OF THE RIGHT HIP WITHOUT CONTRAST TECHNIQUE: Multidetector CT imaging of the right hip was performed according to the standard protocol. Multiplanar CT image reconstructions were also generated. COMPARISON:  Right hip radiograph dated 03/31/2018 and 10/29/2017 FINDINGS: Bones/Joint/Cartilage There is an impacted transverse fracture of the right femoral neck. There is no dislocation. There is advanced osteopenia. Ligaments Suboptimally assessed by CT. Muscles and Tendons No acute findings. Soft tissues No acute findings. IMPRESSION: Impacted transverse fracture of the right femoral neck. Electronically Signed   By: Anner Crete M.D.   On: 03/31/2018 03:01   Dg Chest Portable 1 View  Result Date: 03/31/2018 CLINICAL DATA:  Preop for hip surgery. Hypertension. COPD. Smoker. EXAM: PORTABLE CHEST 1 VIEW COMPARISON:  None. FINDINGS: Midline  trachea. Normal heart size. Atherosclerosis in the transverse aorta. No pleural effusion or pneumothorax. Mild hyperinflation. Clear lungs. IMPRESSION: 1. Hyperinflation, without acute disease. 2. Aortic atherosclerosis. Electronically Signed   By: Abigail Miyamoto M.D.   On: 03/31/2018 02:08   Dg Abd Portable 1v  Result Date: 03/31/2018 CLINICAL DATA:  Abdominal pain EXAM: PORTABLE ABDOMEN - 1 VIEW COMPARISON:  None. FINDINGS: No dilated small bowel loops. Moderate stool throughout the large bowel. Mild gaseous distention of the stomach. No evidence of pneumatosis or pneumoperitoneum. Clear lung bases. No radiopaque nephrolithiasis. Impacted right femoral neck fracture is partially visualized. IMPRESSION: Nonobstructive bowel gas pattern. Moderate colonic stool volume, suggesting constipation. Partially visualized impacted right femoral neck fracture. Electronically Signed   By: Ilona Sorrel M.D.   On: 03/31/2018 06:51   Dg Hip Unilat  With Pelvis 2-3 Views Right  Result Date: 03/31/2018 CLINICAL DATA:  Hip pain EXAM: DG HIP (WITH OR WITHOUT PELVIS) 2-3V RIGHT COMPARISON:  10/29/2017 FINDINGS: Superiorly displaced, overriding fracture of the right femoral neck. Femoral head remains situated in the acetabulum. No other pelvic fracture. No pelvic diastasis. IMPRESSION: Superiorly displaced, overriding fracture of the right femoral neck. Electronically Signed   By: Ulyses Jarred M.D.   On: 03/31/2018 01:05   Subjective:  Had BM No new complaint.   Discharge Exam: Vitals:   04/02/18 0458 04/02/18 0642  BP: (!) 188/87 (!) 167/77  Pulse: 86 95  Resp: 16   Temp: 97.6 F (36.4 C)   SpO2: 100%    Vitals:   04/01/18 2329 04/02/18 0054 04/02/18 0458 04/02/18 0642  BP: (!) 159/61 (!) 144/64 (!) 188/87 (!) 167/77  Pulse: 77 88 86 95  Resp:   16   Temp:   97.6 F (36.4 C)   TempSrc:      SpO2:   100%   Weight:      Height:        General: Pt is alert, awake, not in acute  distress Cardiovascular: RRR, S1/S2 +, no rubs, no gallops Respiratory: CTA bilaterally, no wheezing, no rhonchi Abdominal: Soft, NT, ND, bowel sounds + Extremities: no edema, no cyanosis    The results of significant diagnostics from this hospitalization (including imaging, microbiology, ancillary and laboratory) are listed below for reference.     Microbiology: Recent Results (from the past 240 hour(s))  MRSA PCR Screening     Status: Abnormal   Collection Time: 03/31/18  4:32  AM  Result Value Ref Range Status   MRSA by PCR POSITIVE (A) NEGATIVE Final    Comment:        The GeneXpert MRSA Assay (FDA approved for NASAL specimens only), is one component of a comprehensive MRSA colonization surveillance program. It is not intended to diagnose MRSA infection nor to guide or monitor treatment for MRSA infections. RESULT CALLED TO, READ BACK BY AND VERIFIED WITH: GREEN,T. RN AT 1100 03/31/18 MULLINS,T Performed at [facility], [place] [address], [place], [place] [address]   Urine Culture     Status: Abnormal   Collection Time: 03/31/18  9:03 AM  Result Value Ref Range Status   Specimen Description   Final    URINE, CLEAN CATCH Performed at [facility], [place] [address] [place], [place] [address]    Special Requests   Final    NONE Performed at [facility], [place] [address] [place], [place] [address]    Culture MULTIPLE SPECIES PRESENT, SUGGEST RECOLLECTION (A)  Final   Report Status 04/01/2018 FINAL  Final     Labs: BNP (last 3 results) No results for input(s): BNP in the last 8760 hours. Basic Metabolic Panel: Recent Labs  Lab 03/31/18 0200 04/01/18 0334 04/01/18 1330 04/02/18 0332  NA 139 136 134*  --   K 4.4 5.2* 4.5  --   CL 103 104 102  --   CO2 _0 --   GLUCOSE 120* 100* 122*  --   BUN 45* 39* 31*  --   CREATININE 2.41* 1.88* 1.56* 1.77*  CALCIUM 8.8* 8.8* 8.6*  --    Liver Function  Tests: Recent Labs  Lab 03/31/18 0200  AST 21  ALT 23  ALKPHOS 97  BILITOT 0.6  PROT 6.9  ALBUMIN 3.3*   No results for input(s): LIPASE, AMYLASE in the last 168 hours. No results for input(s): AMMONIA in the last 168 hours. CBC: Recent Labs  Lab 03/31/18 0200 04/01/18 0334  WBC 11.2* 8.2  NEUTROABS 6.9  --   HGB 9.1* 9.7*  HCT 29.8* 32.1*  MCV 97.4 98.8  PLT 290 231   Cardiac Enzymes: No results for input(s): CKTOTAL, CKMB, CKMBINDEX, TROPONINI in the last 168 hours. BNP: Invalid input(s): POCBNP CBG: Recent Labs  Lab 04/01/18 0735 04/01/18 1217 04/01/18 1658 04/01/18 2148 04/02/18 0728  GLUCAP 73 75 118* 135* 119*   D-Dimer No results for input(s): DDIMER in the last 72 hours. Hgb A1c No results for input(s): HGBA1C in the last 72 hours. Lipid Profile No results for input(s): CHOL, HDL, LDLCALC, TRIG, CHOLHDL, LDLDIRECT in the last 72 hours. Thyroid function studies No results for input(s): TSH, T4TOTAL, T3FREE, THYROIDAB in the last 72 hours.  Invalid input(s): FREET3 Anemia work up No results for input(s): VITAMINB12, FOLATE, FERRITIN, TIBC, IRON, RETICCTPCT in the last 72 hours. Urinalysis    Component Value Date/Time   COLORURINE YELLOW 03/31/2018 0617   APPEARANCEUR HAZY (A) 03/31/2018 0617   LABSPEC 1.012 03/31/2018 0617   PHURINE 6.0 03/31/2018 0617   GLUCOSEU NEGATIVE 03/31/2018 0617   HGBUR SMALL (A) 03/31/2018 0617   BILIRUBINUR NEGATIVE 03/31/2018 0617   KETONESUR NEGATIVE 03/31/2018 0617   PROTEINUR 30 (A) 03/31/2018 0617   NITRITE NEGATIVE 03/31/2018 0617   LEUKOCYTESUR LARGE (A) 03/31/2018 0617   Sepsis Labs Invalid input(s): PROCALCITONIN,  WBC,  LACTICIDVEN Microbiology Recent Results (from the past 240 hour(s))  MRSA PCR Screening     Status: Abnormal  Collection Time: 03/31/18  4:32 AM  Result Value Ref Range Status   MRSA by PCR POSITIVE (A) NEGATIVE Final    Comment:        The GeneXpert MRSA Assay (FDA approved for  NASAL specimens only), is one component of a comprehensive MRSA colonization surveillance program. It is not intended to diagnose MRSA infection nor to guide or monitor treatment for MRSA infections. RESULT CALLED TO, READ BACK BY AND VERIFIED WITH: GREEN,T. RN AT 1100 03/31/18 MULLINS,T Performed at [facility], [place] [address], [place], [place] [address]   Urine Culture     Status: Abnormal   Collection Time: 03/31/18  9:03 AM  Result Value Ref Range Status   Specimen Description   Final    URINE, CLEAN CATCH Performed at [facility], [place] [address], [place], [place] [address]    Special Requests   Final    NONE Performed at [facility], [place] [address] [place], [place] [address]    Culture MULTIPLE SPECIES PRESENT, SUGGEST RECOLLECTION (A)  Final   Report Status 04/01/2018 FINAL  Final     Time coordinating discharge: 35 minutes.   SIGNED:   Elmarie Shiley, MD  [facility] 04/02/2018, 8:43 AM    If 7PM-7AM, please contact night-coverage [email or web address] Password [email or web address]

## 2018-04-02 NOTE — Progress Notes (Signed)
RN called report to Clinical biochemist at [facility].   Al questions answered.   Patient transported via [facility] to Skilled Facility.

## 2018-04-02 NOTE — Progress Notes (Signed)
Per [facility], the patient does not have active insurance and is considered to be "on the difficult to place list" at the facility. The facility will continue to provide care.  Patient will return to room 803B.  Nurse call report to: [phone] [facility] to transport.   Vivi Barrack, [place], MSW Clinical Social Worker  [phone] 04/02/2018  9:57 AM

## 2018-04-03 ENCOUNTER — Other Ambulatory Visit (INDEPENDENT_AMBULATORY_CARE_PROVIDER_SITE_OTHER): Payer: Self-pay | Admitting: Physician Assistant

## 2018-04-07 NOTE — Progress Notes (Signed)
Instructions faxed to [facility] for surgery on Tuesday. I confirmed with Famata, LPN that instructions had been received.

## 2018-04-07 NOTE — Pre-Procedure Instructions (Signed)
    Bruce Garcia  04/07/2018     Your procedure is scheduled on January 28.  Report to [facility] Admitting at 1:30 PM.  Call this number if you have problems the morning of surgery:  [phone]   Remember:  Do not eat or drink after midnight.  Give these medicines the morning of surgery with A SIP OF WATER Norvasc, Cymbalta, Gabapentin, Hydralazine, Flonase, Spiriva, Eye drops  PRN meds if needed: Hydrocodone Albuterol   Please send MAR that we can see the date and time that the last dose of medications were administered printed after AM meds   Please send any cardiac test or notes  Check your blood sugar the morning of his surgery when you wake up and every 2 hours until you get to the Short Stay unit.  . If his  blood sugar is less than 70 mg/dL, you will need to treat for low blood sugar: o Treat a low blood sugar (less than 70 mg/dL) with  cup of clear juice (cranberry or apple), 4 glucose tablets, OR glucose gel. Recheck blood sugar in 15 minutes after treatment (to make sure it is greater than 70 mg/dL). If your blood sugar is not greater than 70 mg/dL on recheck, call [phone] for further instructions.    Do not wear jewelry, make-up or nail polish.  Do not wear lotions, powders, or perfumes, or deodorant.  Do not shave 48 hours prior to surgery.  Men may shave face and neck.  Do not bring valuables to the hospital.  [facility] is not responsible for any belongings or valuables.

## 2018-04-09 MED ORDER — CLINDAMYCIN PHOSPHATE 900 MG/50ML IV SOLN
900.0000 mg | INTRAVENOUS | Status: AC
  Administered 2018-04-10: 900 mg via INTRAVENOUS
  Filled 2018-04-09: qty 50

## 2018-04-09 MED ORDER — TRANEXAMIC ACID-NACL 1000-0.7 MG/100ML-% IV SOLN
1000.0000 mg | INTRAVENOUS | Status: AC
  Administered 2018-04-10: 1000 mg via INTRAVENOUS
  Filled 2018-04-09: qty 100

## 2018-04-09 NOTE — Progress Notes (Addendum)
Spoke with Konrad Felix at [facility] for patient to arrive 0930. She spoke to transportation and advised that patient would be here.

## 2018-04-10 ENCOUNTER — Other Ambulatory Visit: Payer: Self-pay

## 2018-04-10 ENCOUNTER — Inpatient Hospital Stay (HOSPITAL_COMMUNITY): Payer: Medicare Other | Admitting: Anesthesiology

## 2018-04-10 ENCOUNTER — Encounter (HOSPITAL_COMMUNITY)
Admission: RE | Disposition: A | Discharge status: Skilled Nursing Facility | Payer: Self-pay | Source: Home / Self Care | Attending: Orthopaedic Surgery

## 2018-04-10 ENCOUNTER — Encounter (HOSPITAL_COMMUNITY): Payer: Self-pay

## 2018-04-10 ENCOUNTER — Inpatient Hospital Stay (HOSPITAL_COMMUNITY): Payer: Medicare Other

## 2018-04-10 ENCOUNTER — Inpatient Hospital Stay (HOSPITAL_COMMUNITY)
Admission: RE | Admit: 2018-04-10 | Discharge: 2018-04-13 | DRG: 470 | Disposition: A | Discharge status: Skilled Nursing Facility | Payer: Medicare Other | Attending: Orthopaedic Surgery | Admitting: Orthopaedic Surgery

## 2018-04-10 DIAGNOSIS — Z419 Encounter for procedure for purposes other than remedying health state, unspecified: Secondary | ICD-10-CM

## 2018-04-10 DIAGNOSIS — Z791 Long term (current) use of non-steroidal anti-inflammatories (NSAID): Secondary | ICD-10-CM | POA: Diagnosis not present

## 2018-04-10 DIAGNOSIS — Z79899 Other long term (current) drug therapy: Secondary | ICD-10-CM | POA: Diagnosis not present

## 2018-04-10 DIAGNOSIS — K219 Gastro-esophageal reflux disease without esophagitis: Secondary | ICD-10-CM | POA: Diagnosis present

## 2018-04-10 DIAGNOSIS — E7800 Pure hypercholesterolemia, unspecified: Secondary | ICD-10-CM | POA: Diagnosis present

## 2018-04-10 DIAGNOSIS — F419 Anxiety disorder, unspecified: Secondary | ICD-10-CM | POA: Diagnosis present

## 2018-04-10 DIAGNOSIS — J449 Chronic obstructive pulmonary disease, unspecified: Secondary | ICD-10-CM | POA: Diagnosis present

## 2018-04-10 DIAGNOSIS — D62 Acute posthemorrhagic anemia: Secondary | ICD-10-CM | POA: Diagnosis not present

## 2018-04-10 DIAGNOSIS — N289 Disorder of kidney and ureter, unspecified: Secondary | ICD-10-CM | POA: Diagnosis present

## 2018-04-10 DIAGNOSIS — E119 Type 2 diabetes mellitus without complications: Secondary | ICD-10-CM | POA: Diagnosis present

## 2018-04-10 DIAGNOSIS — Z887 Allergy status to serum and vaccine status: Secondary | ICD-10-CM | POA: Diagnosis not present

## 2018-04-10 DIAGNOSIS — Z87891 Personal history of nicotine dependence: Secondary | ICD-10-CM

## 2018-04-10 DIAGNOSIS — Z96641 Presence of right artificial hip joint: Secondary | ICD-10-CM

## 2018-04-10 DIAGNOSIS — X58XXXA Exposure to other specified factors, initial encounter: Secondary | ICD-10-CM | POA: Diagnosis present

## 2018-04-10 DIAGNOSIS — I10 Essential (primary) hypertension: Secondary | ICD-10-CM | POA: Diagnosis present

## 2018-04-10 DIAGNOSIS — S72041A Displaced fracture of base of neck of right femur, initial encounter for closed fracture: Secondary | ICD-10-CM | POA: Diagnosis present

## 2018-04-10 DIAGNOSIS — Z91018 Allergy to other foods: Secondary | ICD-10-CM | POA: Diagnosis not present

## 2018-04-10 DIAGNOSIS — F329 Major depressive disorder, single episode, unspecified: Secondary | ICD-10-CM | POA: Diagnosis present

## 2018-04-10 DIAGNOSIS — D539 Nutritional anemia, unspecified: Secondary | ICD-10-CM | POA: Diagnosis present

## 2018-04-10 DIAGNOSIS — Z880 Allergy status to penicillin: Secondary | ICD-10-CM

## 2018-04-10 DIAGNOSIS — S72041G Displaced fracture of base of neck of right femur, subsequent encounter for closed fracture with delayed healing: Secondary | ICD-10-CM

## 2018-04-10 HISTORY — PX: TOTAL HIP ARTHROPLASTY: SHX124

## 2018-04-10 LAB — GLUCOSE, CAPILLARY
GLUCOSE-CAPILLARY: 145 mg/dL — AB (ref 70–99)
Glucose-Capillary: 112 mg/dL — ABNORMAL HIGH (ref 70–99)
Glucose-Capillary: 115 mg/dL — ABNORMAL HIGH (ref 70–99)
Glucose-Capillary: 130 mg/dL — ABNORMAL HIGH (ref 70–99)
Glucose-Capillary: 157 mg/dL — ABNORMAL HIGH (ref 70–99)

## 2018-04-10 SURGERY — ARTHROPLASTY, HIP, TOTAL, ANTERIOR APPROACH
Anesthesia: Spinal | Laterality: Right

## 2018-04-10 MED ORDER — DOCUSATE SODIUM 100 MG PO CAPS
100.0000 mg | ORAL_CAPSULE | Freq: Two times a day (BID) | ORAL | Status: DC
  Administered 2018-04-10 – 2018-04-13 (×5): 100 mg via ORAL
  Filled 2018-04-10 (×6): qty 1

## 2018-04-10 MED ORDER — FENTANYL CITRATE (PF) 250 MCG/5ML IJ SOLN
INTRAMUSCULAR | Status: AC
  Filled 2018-04-10: qty 5

## 2018-04-10 MED ORDER — INSULIN ASPART 100 UNIT/ML ~~LOC~~ SOLN: 0.0000 [IU] | Freq: Three times a day (TID) | SUBCUTANEOUS | Status: DC

## 2018-04-10 MED ORDER — CLINDAMYCIN PHOSPHATE 600 MG/50ML IV SOLN
600.0000 mg | Freq: Four times a day (QID) | INTRAVENOUS | Status: AC
  Administered 2018-04-10 – 2018-04-11 (×2): 600 mg via INTRAVENOUS
  Filled 2018-04-10 (×2): qty 50

## 2018-04-10 MED ORDER — HYDROMORPHONE HCL 1 MG/ML IJ SOLN
INTRAMUSCULAR | Status: AC
  Filled 2018-04-10: qty 1

## 2018-04-10 MED ORDER — OXYCODONE HCL 5 MG PO TABS
ORAL_TABLET | ORAL | Status: AC
  Filled 2018-04-10: qty 1

## 2018-04-10 MED ORDER — FERROUS SULFATE 325 (65 FE) MG PO TABS
325.0000 mg | ORAL_TABLET | Freq: Every day | ORAL | Status: DC
  Administered 2018-04-11 – 2018-04-13 (×3): 325 mg via ORAL
  Filled 2018-04-10 (×3): qty 1

## 2018-04-10 MED ORDER — MENTHOL 3 MG MT LOZG: 1.0000 | LOZENGE | OROMUCOSAL | Status: DC | PRN

## 2018-04-10 MED ORDER — METOCLOPRAMIDE HCL 5 MG PO TABS: 5.0000 mg | ORAL_TABLET | Freq: Three times a day (TID) | ORAL | Status: DC | PRN

## 2018-04-10 MED ORDER — OXYCODONE HCL 5 MG/5ML PO SOLN: 5.0000 mg | Freq: Once | ORAL | Status: AC | PRN

## 2018-04-10 MED ORDER — DIPHENHYDRAMINE HCL 12.5 MG/5ML PO ELIX: 12.5000 mg | ORAL_SOLUTION | ORAL | Status: DC | PRN

## 2018-04-10 MED ORDER — ONDANSETRON HCL 4 MG PO TABS: 4.0000 mg | ORAL_TABLET | Freq: Four times a day (QID) | ORAL | Status: DC | PRN

## 2018-04-10 MED ORDER — GABAPENTIN 300 MG PO CAPS
600.0000 mg | ORAL_CAPSULE | Freq: Two times a day (BID) | ORAL | Status: DC
  Administered 2018-04-10 – 2018-04-13 (×6): 600 mg via ORAL
  Filled 2018-04-10 (×6): qty 2

## 2018-04-10 MED ORDER — METHOCARBAMOL 500 MG PO TABS
500.0000 mg | ORAL_TABLET | Freq: Four times a day (QID) | ORAL | Status: DC | PRN
  Administered 2018-04-10 – 2018-04-13 (×6): 500 mg via ORAL
  Filled 2018-04-10 (×5): qty 1

## 2018-04-10 MED ORDER — ALBUTEROL SULFATE HFA 108 (90 BASE) MCG/ACT IN AERS: 2.0000 | INHALATION_SPRAY | Freq: Four times a day (QID) | RESPIRATORY_TRACT | Status: DC | PRN

## 2018-04-10 MED ORDER — ALBUTEROL SULFATE (2.5 MG/3ML) 0.083% IN NEBU: 2.5000 mg | INHALATION_SOLUTION | Freq: Four times a day (QID) | RESPIRATORY_TRACT | Status: DC | PRN

## 2018-04-10 MED ORDER — HYDROMORPHONE HCL 1 MG/ML IJ SOLN
0.2500 mg | INTRAMUSCULAR | Status: DC | PRN
  Administered 2018-04-10 (×4): 0.5 mg via INTRAVENOUS

## 2018-04-10 MED ORDER — PANTOPRAZOLE SODIUM 40 MG PO TBEC
40.0000 mg | DELAYED_RELEASE_TABLET | Freq: Every day | ORAL | Status: DC
  Administered 2018-04-11 – 2018-04-13 (×3): 40 mg via ORAL
  Filled 2018-04-10 (×3): qty 1

## 2018-04-10 MED ORDER — OXYCODONE HCL 5 MG PO TABS
5.0000 mg | ORAL_TABLET | ORAL | Status: DC | PRN
  Administered 2018-04-10 – 2018-04-12 (×5): 10 mg via ORAL
  Filled 2018-04-10 (×5): qty 2

## 2018-04-10 MED ORDER — 0.9 % SODIUM CHLORIDE (POUR BTL) OPTIME
TOPICAL | Status: DC | PRN
  Administered 2018-04-10: 1000 mL

## 2018-04-10 MED ORDER — ONDANSETRON HCL 4 MG/2ML IJ SOLN: 4.0000 mg | Freq: Four times a day (QID) | INTRAMUSCULAR | Status: DC | PRN

## 2018-04-10 MED ORDER — LACTATED RINGERS IV SOLN: INTRAVENOUS | Status: DC

## 2018-04-10 MED ORDER — FENTANYL CITRATE (PF) 100 MCG/2ML IJ SOLN
INTRAMUSCULAR | Status: DC | PRN
  Administered 2018-04-10: 50 ug via INTRAVENOUS

## 2018-04-10 MED ORDER — SODIUM CHLORIDE 0.9 % IV SOLN
INTRAVENOUS | Status: DC
  Administered 2018-04-10: 19:00:00 via INTRAVENOUS

## 2018-04-10 MED ORDER — ACETAMINOPHEN 325 MG PO TABS: 325.0000 mg | ORAL_TABLET | Freq: Four times a day (QID) | ORAL | Status: DC | PRN

## 2018-04-10 MED ORDER — NITROGLYCERIN 0.4 MG SL SUBL: 0.4000 mg | SUBLINGUAL_TABLET | SUBLINGUAL | Status: DC | PRN

## 2018-04-10 MED ORDER — OXYCODONE HCL 5 MG PO TABS
5.0000 mg | ORAL_TABLET | Freq: Once | ORAL | Status: AC | PRN
  Administered 2018-04-10: 5 mg via ORAL

## 2018-04-10 MED ORDER — OXYCODONE HCL 5 MG PO TABS
10.0000 mg | ORAL_TABLET | ORAL | Status: DC | PRN
  Administered 2018-04-11 – 2018-04-13 (×2): 10 mg via ORAL
  Filled 2018-04-10: qty 3
  Filled 2018-04-10: qty 2

## 2018-04-10 MED ORDER — PROMETHAZINE HCL 25 MG/ML IJ SOLN: 6.2500 mg | INTRAMUSCULAR | Status: DC | PRN

## 2018-04-10 MED ORDER — METOCLOPRAMIDE HCL 5 MG/ML IJ SOLN: 5.0000 mg | Freq: Three times a day (TID) | INTRAMUSCULAR | Status: DC | PRN

## 2018-04-10 MED ORDER — HYDROMORPHONE HCL 1 MG/ML IJ SOLN
0.5000 mg | INTRAMUSCULAR | Status: DC | PRN
  Administered 2018-04-10 – 2018-04-11 (×2): 1 mg via INTRAVENOUS
  Filled 2018-04-10 (×2): qty 1

## 2018-04-10 MED ORDER — ASPIRIN 81 MG PO CHEW
81.0000 mg | CHEWABLE_TABLET | Freq: Two times a day (BID) | ORAL | Status: DC
  Administered 2018-04-10 – 2018-04-13 (×6): 81 mg via ORAL
  Filled 2018-04-10 (×6): qty 1

## 2018-04-10 MED ORDER — AMLODIPINE BESYLATE 10 MG PO TABS
10.0000 mg | ORAL_TABLET | Freq: Every day | ORAL | Status: DC
  Administered 2018-04-11 – 2018-04-13 (×3): 10 mg via ORAL
  Filled 2018-04-10 (×3): qty 1

## 2018-04-10 MED ORDER — OXYCODONE HCL 5 MG PO TABS
ORAL_TABLET | ORAL | Status: AC
  Filled 2018-04-10: qty 2

## 2018-04-10 MED ORDER — PHENOL 1.4 % MT LIQD: 1.0000 | OROMUCOSAL | Status: DC | PRN

## 2018-04-10 MED ORDER — FLUTICASONE PROPIONATE 50 MCG/ACT NA SUSP
2.0000 | Freq: Every day | NASAL | Status: DC | PRN
  Filled 2018-04-10: qty 16

## 2018-04-10 MED ORDER — SODIUM CHLORIDE 0.9 % IR SOLN
Status: DC | PRN
  Administered 2018-04-10: 3000 mL

## 2018-04-10 MED ORDER — LACTATED RINGERS IV SOLN
INTRAVENOUS | Status: DC | PRN
  Administered 2018-04-10 (×3): via INTRAVENOUS

## 2018-04-10 MED ORDER — ADULT MULTIVITAMIN W/MINERALS CH
1.0000 | ORAL_TABLET | Freq: Every day | ORAL | Status: DC
  Administered 2018-04-12 – 2018-04-13 (×2): 1 via ORAL
  Filled 2018-04-10 (×3): qty 1

## 2018-04-10 MED ORDER — UMECLIDINIUM BROMIDE 62.5 MCG/INH IN AEPB
1.0000 | INHALATION_SPRAY | Freq: Every day | RESPIRATORY_TRACT | Status: DC
  Filled 2018-04-10: qty 7

## 2018-04-10 MED ORDER — TAMSULOSIN HCL 0.4 MG PO CAPS
0.4000 mg | ORAL_CAPSULE | Freq: Every day | ORAL | Status: DC
  Administered 2018-04-10 – 2018-04-13 (×4): 0.4 mg via ORAL
  Filled 2018-04-10 (×4): qty 1

## 2018-04-10 MED ORDER — BUPIVACAINE HCL (PF) 0.75 % IJ SOLN
INTRAMUSCULAR | Status: DC | PRN
  Administered 2018-04-10: 1.8 mL via PERINEURAL

## 2018-04-10 MED ORDER — MELATONIN 3 MG PO TABS
6.0000 mg | ORAL_TABLET | Freq: Every evening | ORAL | Status: DC | PRN
  Filled 2018-04-10: qty 2

## 2018-04-10 MED ORDER — PROPOFOL 10 MG/ML IV BOLUS
INTRAVENOUS | Status: DC | PRN
  Administered 2018-04-10: 30 mg via INTRAVENOUS

## 2018-04-10 MED ORDER — METHOCARBAMOL 1000 MG/10ML IJ SOLN
500.0000 mg | Freq: Four times a day (QID) | INTRAVENOUS | Status: DC | PRN
  Filled 2018-04-10: qty 5

## 2018-04-10 MED ORDER — HYDROMORPHONE HCL 1 MG/ML IJ SOLN
INTRAMUSCULAR | Status: AC
  Administered 2018-04-10: 1 mg
  Filled 2018-04-10: qty 1

## 2018-04-10 MED ORDER — ALUM & MAG HYDROXIDE-SIMETH 200-200-20 MG/5ML PO SUSP: 30.0000 mL | ORAL | Status: DC | PRN

## 2018-04-10 MED ORDER — CHLORHEXIDINE GLUCONATE 4 % EX LIQD: 60.0000 mL | Freq: Once | CUTANEOUS | Status: DC

## 2018-04-10 MED ORDER — METHOCARBAMOL 500 MG PO TABS
ORAL_TABLET | ORAL | Status: AC
  Filled 2018-04-10: qty 1

## 2018-04-10 MED ORDER — HYDRALAZINE HCL 25 MG PO TABS
25.0000 mg | ORAL_TABLET | Freq: Two times a day (BID) | ORAL | Status: DC
  Administered 2018-04-10 – 2018-04-13 (×6): 25 mg via ORAL
  Filled 2018-04-10 (×7): qty 1

## 2018-04-10 MED ORDER — PROPOFOL 500 MG/50ML IV EMUL
INTRAVENOUS | Status: DC | PRN
  Administered 2018-04-10: 75 ug/kg/min via INTRAVENOUS

## 2018-04-10 MED ORDER — POLYETHYLENE GLYCOL 3350 17 G PO PACK: 17.0000 g | PACK | Freq: Every day | ORAL | Status: DC | PRN

## 2018-04-10 MED ORDER — DULOXETINE HCL 60 MG PO CPEP
60.0000 mg | ORAL_CAPSULE | Freq: Every day | ORAL | Status: DC
  Administered 2018-04-12 – 2018-04-13 (×2): 60 mg via ORAL
  Filled 2018-04-10 (×3): qty 1

## 2018-04-10 SURGICAL SUPPLY — 55 items
BENZOIN TINCTURE PRP APPL 2/3 (GAUZE/BANDAGES/DRESSINGS) ×3 IMPLANT
BLADE CLIPPER SURG (BLADE) IMPLANT
BLADE SAW SGTL 18X1.27X75 (BLADE) ×2 IMPLANT
BLADE SAW SGTL 18X1.27X75MM (BLADE) ×1
CLOSURE WOUND 1/2 X4 (GAUZE/BANDAGES/DRESSINGS) ×2
COLLAR OFFSET CORAIL SZ 12 HIP (Stem) ×1 IMPLANT
CORAIL OFFSET COLLAR SZ 12 HIP (Stem) ×3 IMPLANT
COVER SURGICAL LIGHT HANDLE (MISCELLANEOUS) ×3 IMPLANT
COVER WAND RF STERILE (DRAPES) ×3 IMPLANT
DRAPE C-ARM 42X72 X-RAY (DRAPES) ×3 IMPLANT
DRAPE STERI IOBAN 125X83 (DRAPES) ×3 IMPLANT
DRAPE U-SHAPE 47X51 STRL (DRAPES) ×9 IMPLANT
DRSG AQUACEL AG ADV 3.5X10 (GAUZE/BANDAGES/DRESSINGS) ×3 IMPLANT
DURAPREP 26ML APPLICATOR (WOUND CARE) ×3 IMPLANT
ELECT BLADE 4.0 EZ CLEAN MEGAD (MISCELLANEOUS) ×3
ELECT BLADE 6.5 EXT (BLADE) IMPLANT
ELECT REM PT RETURN 9FT ADLT (ELECTROSURGICAL) ×3
ELECTRODE BLDE 4.0 EZ CLN MEGD (MISCELLANEOUS) ×1 IMPLANT
ELECTRODE REM PT RTRN 9FT ADLT (ELECTROSURGICAL) ×1 IMPLANT
FACESHIELD WRAPAROUND (MASK) ×6 IMPLANT
GLOVE BIOGEL PI IND STRL 8 (GLOVE) ×2 IMPLANT
GLOVE BIOGEL PI INDICATOR 8 (GLOVE) ×4
GLOVE ECLIPSE 8.0 STRL XLNG CF (GLOVE) ×3 IMPLANT
GLOVE ORTHO TXT STRL SZ7.5 (GLOVE) ×6 IMPLANT
GOWN STRL REUS W/ TWL LRG LVL3 (GOWN DISPOSABLE) ×2 IMPLANT
GOWN STRL REUS W/ TWL XL LVL3 (GOWN DISPOSABLE) ×2 IMPLANT
GOWN STRL REUS W/TWL LRG LVL3 (GOWN DISPOSABLE) ×4
GOWN STRL REUS W/TWL XL LVL3 (GOWN DISPOSABLE) ×4
HANDPIECE INTERPULSE COAX TIP (DISPOSABLE) ×2
HEAD M SROM 36MM PLUS 1.5 (Hips) ×1 IMPLANT
KIT BASIN OR (CUSTOM PROCEDURE TRAY) ×3 IMPLANT
KIT TURNOVER KIT B (KITS) ×3 IMPLANT
LINER ACETAB NEUTRAL 36ID 520D (Liner) ×3 IMPLANT
MANIFOLD NEPTUNE II (INSTRUMENTS) ×3 IMPLANT
NS IRRIG 1000ML POUR BTL (IV SOLUTION) ×3 IMPLANT
PACK TOTAL JOINT (CUSTOM PROCEDURE TRAY) ×3 IMPLANT
PAD ARMBOARD 7.5X6 YLW CONV (MISCELLANEOUS) ×3 IMPLANT
PIN SECTOR W/GRIP ACE CUP 52MM (Hips) ×3 IMPLANT
SET HNDPC FAN SPRY TIP SCT (DISPOSABLE) ×1 IMPLANT
SROM M HEAD 36MM PLUS 1.5 (Hips) ×3 IMPLANT
STAPLER VISISTAT 35W (STAPLE) IMPLANT
STRIP CLOSURE SKIN 1/2X4 (GAUZE/BANDAGES/DRESSINGS) ×4 IMPLANT
SUT ETHIBOND NAB CT1 #1 30IN (SUTURE) ×3 IMPLANT
SUT MNCRL AB 4-0 PS2 18 (SUTURE) IMPLANT
SUT VIC AB 0 CT1 27 (SUTURE) ×2
SUT VIC AB 0 CT1 27XBRD ANBCTR (SUTURE) ×1 IMPLANT
SUT VIC AB 1 CT1 27 (SUTURE) ×2
SUT VIC AB 1 CT1 27XBRD ANBCTR (SUTURE) ×1 IMPLANT
SUT VIC AB 2-0 CT1 27 (SUTURE) ×2
SUT VIC AB 2-0 CT1 TAPERPNT 27 (SUTURE) ×1 IMPLANT
TOWEL OR 17X24 6PK STRL BLUE (TOWEL DISPOSABLE) ×3 IMPLANT
TOWEL OR 17X26 10 PK STRL BLUE (TOWEL DISPOSABLE) ×3 IMPLANT
TRAY CATH 16FR W/PLASTIC CATH (SET/KITS/TRAYS/PACK) IMPLANT
TRAY FOLEY MTR SLVR 16FR STAT (SET/KITS/TRAYS/PACK) IMPLANT
WATER STERILE IRR 1000ML POUR (IV SOLUTION) ×6 IMPLANT

## 2018-04-10 NOTE — Anesthesia Preprocedure Evaluation (Signed)
Anesthesia Evaluation  Patient identified by MRN, date of birth, ID band Patient awake    Reviewed: Allergy & Precautions, NPO status , Patient's Chart, lab work & pertinent test results  Airway Mallampati: II  TM Distance: >3 FB Neck ROM: Full    Dental no notable dental hx.    Pulmonary COPD, former smoker,    Pulmonary exam normal breath sounds clear to auscultation       Cardiovascular hypertension, Pt. on medications negative cardio ROS Normal cardiovascular exam Rhythm:Regular Rate:Normal     Neuro/Psych Anxiety Depression negative neurological ROS  negative psych ROS   GI/Hepatic Neg liver ROS, GERD  ,  Endo/Other  negative endocrine ROSdiabetes, Type 2  Renal/GU Renal InsufficiencyRenal disease  negative genitourinary   Musculoskeletal negative musculoskeletal ROS (+)   Abdominal   Peds negative pediatric ROS (+)  Hematology negative hematology ROS (+)   Anesthesia Other Findings   Reproductive/Obstetrics negative OB ROS                             Anesthesia Physical Anesthesia Plan  ASA: III  Anesthesia Plan: Spinal   Post-op Pain Management:    Induction: Intravenous  PONV Risk Score and Plan: 1 and Ondansetron  Airway Management Planned: Simple Face Mask  Additional Equipment:   Intra-op Plan:   Post-operative Plan:   Informed Consent: I have reviewed the patients History and Physical, chart, labs and discussed the procedure including the risks, benefits and alternatives for the proposed anesthesia with the patient or authorized representative who has indicated his/her understanding and acceptance.     Dental advisory given  Plan Discussed with: CRNA  Anesthesia Plan Comments:         Anesthesia Quick Evaluation

## 2018-04-10 NOTE — Brief Op Note (Signed)
04/10/2018  2:05 PM  PATIENT:  Bruce Garcia  68 y.o. male  PRE-OPERATIVE DIAGNOSIS:  right hip femoral neck fracture  POST-OPERATIVE DIAGNOSIS:  right hip femoral neck fracture  PROCEDURE:  Procedure(s): RIGHT TOTAL HIP ARTHROPLASTY ANTERIOR APPROACH (Right)  SURGEON:  Surgeon(s) and Role:    Kathryne Hitch, MD - Primary  PHYSICIAN ASSISTANT: Rexene Edison, PA-C  ANESTHESIA:   spinal  EBL:  200 mL   COUNTS:  YES  PLAN OF CARE: Admit to inpatient   PATIENT DISPOSITION:  PACU - hemodynamically stable.   Delay start of Pharmacological VTE agent (>24hrs) due to surgical blood loss or risk of bleeding: no

## 2018-04-10 NOTE — Op Note (Signed)
NAME: Bruce Garcia, Bruce Garcia MEDICAL RECORD EV:[id number] ACCOUNT [id number] DATE OF BIRTH:[birth year] FACILITY: [facility] LOCATION: [facility] PHYSICIAN:Dorothey Oetken Aretha Parrot, MD  OPERATIVE REPORT  DATE OF PROCEDURE:  04/10/2018  PREOPERATIVE DIAGNOSIS:  Displaced subacute right hip femoral neck fracture.  POSTOPERATIVE DIAGNOSIS:  Displaced subacute right hip femoral neck fracture.  PROCEDURE:  Right total hip arthroplasty through direct anterior approach.  IMPLANTS:  DePuy Sector Gription acetabular component size 52, size 36+0 polyethylene liner, size 12 Corail femoral component with high offset, size 36+1.5 metal hip ball.  SURGEON:  Vanita Panda. Magnus Ivan, MD  ASSISTANT:  Richardean Canal, PA-C.  ANESTHESIA:  Spinal.  ANTIBIOTICS:  900 mg IV clindamycin.  ESTIMATED BLOOD LOSS:  150-200 mL  COMPLICATIONS:  None.  INDICATIONS:  The patient is a 68 year old gentleman who has a subacute displaced right hip femoral neck fracture.  He had been complaining of hip pain for several months.  He actually stays at a nursing care facility and per report has been there since  August.  X-rays in August of his hip were normal.  He says he has not been able to walk on his hip since October.  About 2 weeks ago on a Friday morning, he was complaining of his hip hurting enough that they obtained an x-ray.  He was found to have a  femoral neck fracture and was sent to the emergency room and then admitted to the medicine service.  Upon evaluation, I talked with him and told him that he had a shortened and contracted hip and that he was in his normal state of health the last couple  of months, so that I felt it would be best to send him back to the nursing care facility and bring him in as a regular daytime case due to the difficult nature of the case like this being the fact that there is a subacute fracture, he agreed with this as  well.  He is now presenting for definitive treatment of this hip fracture with  a total hip arthroplasty of the right hip.  I had a long discussion with him about the risks and benefits of surgery.  We talked about the rationale behind it as well.  He  understands the risk of acute blood loss anemia, nerve or vessel injury, fracture, infection, dislocation, DVT and implant failure.  He understands our goals are to decrease pain, improve mobility and overall improve quality of life.  DESCRIPTION OF PROCEDURE:  After informed consent was obtained and appropriate right hip was marked.  He was brought to the operating room and sat up on a stretcher where spinal anesthesia was then obtained.  He was then laid in the supine position on  the stretcher.  Foley catheter was placed and both feet had traction boots applied to them.  Of note, he is significantly shortened on his right injured side.  He was then placed supine on the Hana fracture table, the perineal post in place and both legs  in line skeletal traction device and no traction applied.  His right operative hip was prepped and draped with DuraPrep and sterile drapes.  A time-out was called.  He was identified as correct patient, correct right hip.  I then made an incision just  inferior and posterior to the anterior superior iliac spine and carried this obliquely down the leg.  I dissected down tensor fascia lata muscle.  Tensor fascia was then divided longitudinally to proceed with direct anterior approach to the hip.  We  identified  and cauterized circumflex vessels.  I then identified the hip capsule, opened the hip capsule in an L-type format finding a moderate joint effusion and an obvious subacute femoral neck fracture with impaction of the femoral head.  It did move  as a unit showing that it was trying to heal.  We then placed Cobra retractors around the medial and lateral remnants of the femoral neck and made our femoral neck cut with an oscillating saw distal to the fracture, but proximal to the lesser trochanter.   We  placed a corkscrew guide in the femoral head and removed the femoral head in its entirety and found a nonunion of a femoral neck fracture that was attempting to heal.  There were no cartilage deficits on the femoral head.  We then cut and placed a  bent Hohmann over the medial acetabular rim and removed remnants of the acetabular labrum and other debris from the hip.  We then began reaming under direct visualization from a size 44 reamer in stepwise increments up to a size 51 with all reamers under  direct visualization, the last reamer under direct fluoroscopy, so we could obtain our depth of reaming, our inclination and anteversion.  We then placed the real DePuy Sector Gription acetabular component size 52 and a 36+0 neutral polyethylene liner  for that size acetabular component.  Attention was then turned to the femur.  With the leg externally rotated to 120 degrees, extended and adducted we were able to place a Mueller retractor medially and Hohmann retractor around the greater trochanter,  released lateral joint capsule and used a box-cutting osteotome to enter femoral canal and a rongeur to lateralize.  We then began broaching using the Corail broaching system from a size 8 going up to a size 12.  With a size 12 in place, we trialed a  standard offset femoral neck and I went with a 36-2 hip ball given the fact that he had been significantly shortened and contracted.  Fortunately, we were able to easily reduce the acetabulum.  We assessed it under direct visualization and fluoroscopy  and I felt like he needed more offset and leg length.  We dislocated the hip and removed the trial components.  We then placed the real Corail femoral component size 12 but with high offset and went with a 36+1.5 metal hip ball, reduced this in the  acetabulum and we were pleased with his stability.  After that it was very tight.  We then irrigated the soft tissue with normal saline solution.  We assessed our range of  motion and stability under direct visualization and fluoroscopy.  We then closed  the joint capsule with interrupted #1 Ethibond suture, followed by running 0 Vicryl and tensor fascia was irregular deep tissue, 2-0 Vicryl subcutaneous tissue and interrupted staples on the skin.  Xeroform and Aquacel dressing was applied.  He was taken  off the Hana table and taken to recovery room in stable condition.  All final counts were correct.  There were no complications noted.  Of note, Rexene EdisonGil Clark, PA-C, assisted in the entire case.  His assistance was crucial for facilitating all aspects of  this case.  TN/NUANCE  D:04/10/2018 T:04/10/2018 JOB:[id number]

## 2018-04-10 NOTE — Transfer of Care (Signed)
Immediate Anesthesia Transfer of Care Note  Patient: Bruce Garcia  Procedure(s) Performed: RIGHT TOTAL HIP ARTHROPLASTY ANTERIOR APPROACH (Right )  Patient Location: PACU  Anesthesia Type:Spinal  Level of Consciousness: awake and alert   Airway & Oxygen Therapy: Patient Spontanous Breathing and Patient connected to nasal cannula oxygen  Post-op Assessment: Report given to RN and Post -op Vital signs reviewed and stable  Post vital signs: Reviewed and stable  Last Vitals:  Vitals Value Taken Time  BP    Temp    Pulse    Resp 19 04/10/2018  2:22 PM  SpO2    Vitals shown include unvalidated device data.  Last Pain:  Vitals:   04/10/18 0948  TempSrc:   PainSc: 8       Patients Stated Pain Goal: 3 (04/10/18 0948)  Complications: No apparent anesthesia complications

## 2018-04-10 NOTE — Progress Notes (Signed)
Orthopedic Tech Progress Note Patient Details:  Bruce Garcia 09-11-1950 [id number]  Patient ID: [name], male   DOB: 1950/04/28, 68 y.o.   MRN: [id number] applied ohf  Trinna Post 04/10/2018, 7:42 PM

## 2018-04-10 NOTE — H&P (Signed)
TOTAL HIP ADMISSION H&P  Patient is admitted for right total hip arthroplasty.  Subjective:  Chief Complaint: right hip pain  HPI: Bruce Garcia, 68 y.o. male, has a history of pain and functional disability in the right hip(s) due to trauma and patient has failed non-surgical conservative treatments for greater than 12 weeks to include NSAID's and/or analgesics, use of assistive devices and activity modification.  Onset of symptoms was abrupt starting  3-4 months ago with rapidlly worsening course since that time.The patient noted no past surgery on the right hip(s).  Patient currently rates pain in the right hip at 10 out of 10 with activity. Patient has worsening of pain with activity and weight bearing, pain that interfers with activities of daily living and pain with passive range of motion. Patient has evidence of a subacute displaced fracture of the right femoral neck by imaging studies. This condition presents safety issues increasing the risk of falls.  There is no current active infection.  Patient Active Problem List   Diagnosis Date Noted  . Closed displaced fracture of base of neck of right femur (HCC) 04/10/2018  . Hip fracture (HCC) 03/31/2018  . Leukocytosis 03/31/2018  . Anemia 03/31/2018  . Renal insufficiency 03/31/2018  . HTN (hypertension) 03/31/2018  . COPD (chronic obstructive pulmonary disease) (HCC) 03/31/2018  . Type 2 diabetes mellitus (HCC) 03/31/2018  . Abdominal tenderness 03/31/2018  . Anxiety 03/31/2018   Past Medical History:  Diagnosis Date  . COPD (chronic obstructive pulmonary disease) (HCC)   . Depression   . Diabetes mellitus without complication (HCC)    Type II  . GERD (gastroesophageal reflux disease)   . Hypercholesteremia   . Hypertension   . Inguinal hernia    Right    Past Surgical History:  Procedure Laterality Date  . HERNIA REPAIR      Current Facility-Administered Medications  Medication Dose Route Frequency Provider Last  Rate Last Dose  . chlorhexidine (HIBICLENS) 4 % liquid 4 application  60 mL Topical Once Richardean Canallark, Gilbert W, PA-C      . clindamycin (CLEOCIN) IVPB 900 mg  900 mg Intravenous To SS-Surg Kathryne HitchBlackman, Alvin Diffee Y, MD      . lactated ringers infusion   Intravenous Continuous Lowella CurbMiller, Warren Ray, MD      . tranexamic acid (CYKLOKAPRON) IVPB 1,000 mg  1,000 mg Intravenous To OR Kathryne HitchBlackman, Dylann Layne Y, MD       Allergies  Allergen Reactions  . Chicken Allergy Itching, Nausea And Vomiting and Swelling    SWELLING REACTION UNSPECIFIED    . Penicillins Anaphylaxis    Did it involve swelling of the face/tongue/throat, SOB, or low BP? #  #  #  YES  #  #  #  Did it involve sudden or severe rash/hives, skin peeling, or any reaction on the inside of your mouth or nose? #  #  #  YES  #  #  #  Did you need to seek medical attention at a hospital or doctor's office? UNK When did it last happen?childhood   . Flu Virus Vaccine Nausea And Vomiting  . Pneumococcal Vaccine Nausea And Vomiting    Social History   Tobacco Use  . Smoking status: Former Smoker    Packs/day: 1.50    Last attempt to quit: 01/12/2018    Years since quitting: 0.2  . Smokeless tobacco: Never Used  . Tobacco comment: has not smoked in 3 months  Substance Use Topics  . Alcohol use: Yes  Comment: 4 drinks a week    History reviewed. No pertinent family history.   Review of Systems  Musculoskeletal: Positive for joint pain.  All other systems reviewed and are negative.   Objective:  Physical Exam  Constitutional: He is oriented to person, place, and time. He appears well-developed and well-nourished.  HENT:  Head: Normocephalic and atraumatic.  Eyes: Pupils are equal, round, and reactive to light.  Neck: Normal range of motion.  Cardiovascular: Normal rate.  Respiratory: Effort normal.  GI: Soft.  Musculoskeletal:     Right hip: He exhibits decreased range of motion, decreased strength, tenderness, bony  tenderness and deformity.  Neurological: He is alert and oriented to person, place, and time.  Skin: Skin is warm and dry.  Psychiatric: He has a normal mood and affect.    Vital signs in last 24 hours: Temp:  [97.5 F (36.4 C)] 97.5 F (36.4 C) (01/28 0928) Pulse Rate:  [99] 99 (01/28 0928) Resp:  [16] 16 (01/28 0928) BP: (190)/(78) 190/78 (01/28 0928) SpO2:  [100 %] 100 % (01/28 0928)  Labs:   Estimated body mass index is 23.72 kg/m as calculated from the following:   Height as of this encounter: 5\' 8"  (1.727 m).   Weight as of 03/31/18: 70.8 kg.   Imaging Review Plain radiographs demonstrate a subacute right hip displaced femoral neck fracture   Preoperative templating of the joint replacement has been completed, documented, and submitted to the Operating Room personnel in order to optimize intra-operative equipment management.     Assessment/Plan:  Femoral neck fracture, right hip(s)  The patient history, physical examination, clinical judgement of the provider and imaging studies are consistent with a subacute displaced fracture of the right hip(s) and total hip arthroplasty is deemed medically necessary. The risks and benefits of total hip arthroplasty were presented and reviewed. The risks due to aseptic loosening, infection, stiffness, dislocation/subluxation,  thromboembolic complications and other imponderables were discussed.  The patient acknowledged the explanation, agreed to proceed with the plan and consent was signed. Patient is being admitted for inpatient treatment for surgery, pain control, PT, OT, prophylactic antibiotics, VTE prophylaxis, progressive ambulation and ADL's and discharge planning.The patient is planning to be discharged to skilled nursing facility

## 2018-04-10 NOTE — Plan of Care (Signed)

## 2018-04-10 NOTE — Anesthesia Procedure Notes (Signed)
Spinal  Patient location during procedure: OR Start time: 04/10/2018 12:39 PM End time: 04/10/2018 12:44 PM Staffing Anesthesiologist: Lowella Curb, MD Performed: anesthesiologist  Preanesthetic Checklist Completed: patient identified, site marked, surgical consent, pre-op evaluation, timeout performed, IV checked, risks and benefits discussed and monitors and equipment checked Spinal Block Patient position: right lateral decubitus Prep: Betadine Patient monitoring: heart rate, continuous pulse ox and blood pressure Approach: midline Location: L3-4 Injection technique: single-shot Needle Needle type: Quincke  Needle gauge: 22 G Needle length: 9 cm

## 2018-04-10 NOTE — Anesthesia Procedure Notes (Signed)
Procedure Name: MAC Date/Time: 04/10/2018 12:20 PM Performed by: Eligha Bridegroom, CRNA Pre-anesthesia Checklist: Patient identified, Emergency Drugs available and Patient being monitored Patient Re-evaluated:Patient Re-evaluated prior to induction Oxygen Delivery Method: Nasal cannula Preoxygenation: Pre-oxygenation with 100% oxygen Induction Type: IV induction

## 2018-04-10 NOTE — Progress Notes (Signed)
Pt arrived to room 5N14 via bed after surgery. Received report from [place], RN in PACU. See assessment. Will continue to monitor.

## 2018-04-10 NOTE — Plan of Care (Signed)
  Problem: Pain Managment: Goal: General experience of comfort will improve Outcome: Progressing   Problem: Safety: Goal: Ability to remain free from injury will improve Outcome: Progressing   

## 2018-04-11 ENCOUNTER — Encounter (HOSPITAL_COMMUNITY): Payer: Self-pay | Admitting: Orthopaedic Surgery

## 2018-04-11 LAB — BASIC METABOLIC PANEL
Anion gap: 10 (ref 5–15)
BUN: 24 mg/dL — ABNORMAL HIGH (ref 8–23)
CO2: 24 mmol/L (ref 22–32)
Calcium: 8.7 mg/dL — ABNORMAL LOW (ref 8.9–10.3)
Chloride: 101 mmol/L (ref 98–111)
Creatinine, Ser: 1.65 mg/dL — ABNORMAL HIGH (ref 0.61–1.24)
GFR calc Af Amer: 50 mL/min — ABNORMAL LOW (ref >60)
GFR calc non Af Amer: 43 mL/min — ABNORMAL LOW (ref >60)
GLUCOSE: 158 mg/dL — AB (ref 70–99)
Potassium: 4.3 mmol/L (ref 3.5–5.1)
Sodium: 135 mmol/L (ref 135–145)

## 2018-04-11 LAB — CBC
HCT: 25.5 % — ABNORMAL LOW (ref 39.0–52.0)
Hemoglobin: 8 g/dL — ABNORMAL LOW (ref 13.0–17.0)
MCH: 30.3 pg (ref 26.0–34.0)
MCHC: 31.4 g/dL (ref 30.0–36.0)
MCV: 96.6 fL (ref 80.0–100.0)
Platelets: 243 10*3/uL (ref 150–400)
RBC: 2.64 MIL/uL — ABNORMAL LOW (ref 4.22–5.81)
RDW: 14.5 % (ref 11.5–15.5)
WBC: 11.5 10*3/uL — ABNORMAL HIGH (ref 4.0–10.5)
nRBC: 0 % (ref 0.0–0.2)

## 2018-04-11 LAB — GLUCOSE, CAPILLARY
Glucose-Capillary: 155 mg/dL — ABNORMAL HIGH (ref 70–99)
Glucose-Capillary: 133 mg/dL — ABNORMAL HIGH (ref 70–99)
Glucose-Capillary: 129 mg/dL — ABNORMAL HIGH (ref 70–99)
Glucose-Capillary: 148 mg/dL — ABNORMAL HIGH (ref 70–99)

## 2018-04-11 NOTE — Plan of Care (Signed)
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
## 2018-04-11 NOTE — [place] FL2 (Addendum)
[place] MEDICAID FL2 LEVEL OF CARE SCREENING TOOL     IDENTIFICATION  Patient Name: Bruce Garcia Birthdate: 06-Oct-1950 Sex: male Admission Date (Current Location): 04/10/2018  [place]  [employer]   Facility and Address:  [facility], [address], [place], [place] [address]      Provider Number: [id number]  Attending Physician Name and Address:  Kathryne HitchBlackman, Christopher Y,*  Relative Name and Phone Number:  Babette Relicammy [phone]    Current Level of Care: Hospital Recommended Level of Care: Skilled Nursing Facility Prior Approval Number:    Date Approved/Denied: 06/29/17 PASRR Number: [id number]  Discharge Plan: SNF    Current Diagnoses: Patient Active Problem List   Diagnosis Date Noted  . Closed displaced fracture of base of neck of right femur (HCC) 04/10/2018  . Status post total replacement of right hip 04/10/2018  . Hip fracture (HCC) 03/31/2018  . Leukocytosis 03/31/2018  . Anemia 03/31/2018  . Renal insufficiency 03/31/2018  . HTN (hypertension) 03/31/2018  . COPD (chronic obstructive pulmonary disease) (HCC) 03/31/2018  . Type 2 diabetes mellitus (HCC) 03/31/2018  . Abdominal tenderness 03/31/2018  . Anxiety 03/31/2018    Orientation RESPIRATION BLADDER Height & Weight     Self, Situation, Place, Time  Normal Continent, External catheter Weight: 70.8 kg Height:  5\' 8"  (172.7 cm)  BEHAVIORAL SYMPTOMS/MOOD NEUROLOGICAL BOWEL NUTRITION STATUS      Continent Diet(see discharge summary)  AMBULATORY STATUS COMMUNICATION OF NEEDS Skin   Extensive Assist Verbally Surgical wounds, Other (Comment)(ecchymosis left arm right leg, moisture associated skin right and left groin)                       Personal Care Assistance Level of Assistance    Bathing Assistance: Maximum assistance Feeding assistance: Independent Dressing Assistance: Maximum assistance     Functional Limitations Info  Sight,  Hearing, Speech Sight Info: Adequate Hearing Info: Adequate Speech Info: Adequate    SPECIAL CARE FACTORS FREQUENCY  PT (By licensed PT), OT (By licensed OT)     PT Frequency: min 5x weekly OT Frequency: min 5x weekly            Contractures Contractures Info: Not present    Additional Factors Info  Code Status, Allergies   MRSA contact precautions Code Status Info: full Allergies Info: chicken allergy, Penicillins, flu virus vaccine, pneumococcal vaccine           Current Medications (04/11/2018):  This is the current hospital active medication list Current Facility-Administered Medications  Medication Dose Route Frequency Provider Last Rate Last Dose  . 0.9 %  sodium chloride infusion   Intravenous Continuous Kathryne HitchBlackman, Christopher Y, MD 75 mL/hr at 04/10/18 1917    . acetaminophen (TYLENOL) tablet 325-650 mg  325-650 mg Oral Q6H PRN Kathryne HitchBlackman, Christopher Y, MD      . albuterol (PROVENTIL) (2.5 MG/3ML) 0.083% nebulizer solution 2.5 mg  2.5 mg Nebulization Q6H PRN Kathryne HitchBlackman, Christopher Y, MD      . alum & mag hydroxide-simeth (MAALOX/MYLANTA) 200-200-20 MG/5ML suspension 30 mL  30 mL Oral Q4H PRN Kathryne HitchBlackman, Christopher Y, MD      . amLODipine (NORVASC) tablet 10 mg  10 mg Oral Daily Kathryne HitchBlackman, Christopher Y, MD   10 mg at 04/11/18 [id number]  . aspirin chewable tablet 81 mg  81 mg Oral BID Kathryne HitchBlackman, Christopher Y, MD   81 mg at 04/11/18 0851  . diphenhydrAMINE (BENADRYL) 12.5 MG/5ML elixir 12.5-25 mg  12.5-25 mg Oral Q4H PRN Kathryne Hitch, MD      . docusate sodium (COLACE) capsule 100 mg  100 mg Oral BID Kathryne Hitch, MD   100 mg at 04/10/18 2125  . DULoxetine (CYMBALTA) DR capsule 60 mg  60 mg Oral Daily Kathryne Hitch, MD      . ferrous sulfate tablet 325 mg  325 mg Oral Q breakfast Kathryne Hitch, MD   325 mg at 04/11/18 0848  . fluticasone (FLONASE) 50 MCG/ACT nasal spray 2 spray  2 spray Each Nare Daily PRN Kathryne Hitch, MD       . gabapentin (NEURONTIN) capsule 600 mg  600 mg Oral BID Kathryne Hitch, MD   600 mg at 04/11/18 0854  . hydrALAZINE (APRESOLINE) tablet 25 mg  25 mg Oral BID Kathryne Hitch, MD   25 mg at 04/11/18 0956  . HYDROmorphone (DILAUDID) injection 0.5-1 mg  0.5-1 mg Intravenous Q4H PRN Kathryne Hitch, MD   1 mg at 04/11/18 0425  . insulin aspart (novoLOG) injection 0-14 Units  0-14 Units Subcutaneous TID AC Kathryne Hitch, MD      . Melatonin TABS 6 mg  6 mg Oral QHS PRN Kathryne Hitch, MD      . menthol-cetylpyridinium (CEPACOL) lozenge 3 mg  1 lozenge Oral PRN Kathryne Hitch, MD       Or  . phenol (CHLORASEPTIC) mouth spray 1 spray  1 spray Mouth/Throat PRN Kathryne Hitch, MD      . methocarbamol (ROBAXIN) tablet 500 mg  500 mg Oral Q6H PRN Kathryne Hitch, MD   500 mg at 04/11/18 0957   Or  . methocarbamol (ROBAXIN) 500 mg in dextrose 5 % 50 mL IVPB  500 mg Intravenous Q6H PRN Kathryne Hitch, MD      . metoCLOPramide (REGLAN) tablet 5-10 mg  5-10 mg Oral Q8H PRN Kathryne Hitch, MD       Or  . metoCLOPramide (REGLAN) injection 5-10 mg  5-10 mg Intravenous Q8H PRN Kathryne Hitch, MD      . multivitamin with minerals tablet 1 tablet  1 tablet Oral Daily Kathryne Hitch, MD      . nitroGLYCERIN (NITROSTAT) SL tablet 0.4 mg  0.4 mg Sublingual Q5 min PRN Kathryne Hitch, MD      . ondansetron [facility]) tablet 4 mg  4 mg Oral Q6H PRN Kathryne Hitch, MD       Or  . ondansetron [facility]) injection 4 mg  4 mg Intravenous Q6H PRN Kathryne Hitch, MD      . oxyCODONE (Oxy IR/ROXICODONE) immediate release tablet 10-15 mg  10-15 mg Oral Q4H PRN Kathryne Hitch, MD      . oxyCODONE (Oxy IR/ROXICODONE) immediate release tablet 5-10 mg  5-10 mg Oral Q4H PRN Kathryne Hitch, MD   10 mg at 04/11/18 0850  . pantoprazole (PROTONIX) EC tablet 40 mg  40 mg Oral Daily Kathryne Hitch, MD   40 mg at 04/11/18 0853  . polyethylene glycol (MIRALAX / GLYCOLAX) packet 17 g  17 g Oral Daily PRN Kathryne Hitch, MD      . tamsulosin [facility]) capsule 0.4 mg  0.4 mg Oral Daily Kathryne Hitch, MD   0.4 mg at 04/11/18 0854  . umeclidinium bromide (INCRUSE ELLIPTA) 62.5 MCG/INH 1 puff  1 puff Inhalation Daily Kathryne Hitch, MD  Discharge Medications: Please see discharge summary for a list of discharge medications.  Relevant Imaging Results:  Relevant Lab Results:   Additional Information SSN: [id number]  Gildardo GriffesAshley M Lliam Hoh, LCSW

## 2018-04-11 NOTE — Anesthesia Postprocedure Evaluation (Signed)
Anesthesia Post Note  Patient: Bruce Garcia  Procedure(s) Performed: RIGHT TOTAL HIP ARTHROPLASTY ANTERIOR APPROACH (Right )     Patient location during evaluation: PACU Anesthesia Type: Spinal Level of consciousness: oriented and awake and alert Pain management: pain level controlled Vital Signs Assessment: post-procedure vital signs reviewed and stable Respiratory status: spontaneous breathing and respiratory function stable Cardiovascular status: blood pressure returned to baseline and stable Postop Assessment: no headache, no backache and no apparent nausea or vomiting Anesthetic complications: no    Last Vitals:  Vitals:   04/11/18 0120 04/11/18 0140  BP: 119/78   Pulse: (!) 111 100  Resp: 18   Temp: 36.9 C   SpO2: 95%     Last Pain:  Vitals:   04/11/18 0455  TempSrc:   PainSc: Asleep                 Lowella Curb
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
## 2018-04-11 NOTE — Progress Notes (Addendum)
Patient reports he is refusing at this time to go to SNF upon discharge. Patient reports he lives with his niece [name] who assists him at home. If [name] is not present when patient needs assistance he reports his two sons live nearby that can also assist. Patient reports going to a Skilled Nursing facility in the past and reports he will not go to SNF at discharge.   RNCM notified of home health needs.   Please notify CSW for any further needs.   [place], [place] [phone]

## 2018-04-11 NOTE — Progress Notes (Signed)
Subjective: 1 Day Post-Op Procedure(s) (LRB): RIGHT TOTAL HIP ARTHROPLASTY ANTERIOR APPROACH (Right) Patient reports pain as moderate.  Hgb 8.0, but vitals stable.  Does have chronic anemia, so this is slight acute blood loss anemia on chronic anemia.  Objective: Vital signs in last 24 hours: Temp:  [97.5 F (36.4 C)-98.5 F (36.9 C)] 98.5 F (36.9 C) (01/29 0120) Pulse Rate:  [78-111] 100 (01/29 0140) Resp:  [8-22] 18 (01/29 0120) BP: (119-190)/(59-82) 119/78 (01/29 0120) SpO2:  [93 %-100 %] 95 % (01/29 0120) Weight:  [70.8 kg] 70.8 kg (01/28 1815)  Intake/Output from previous day: 01/28 0701 - 01/29 0700 In: 1120 [P.O.:120; I.V.:1000] Out: 850 [Urine:650; Blood:200] Intake/Output this shift: No intake/output data recorded.  Recent Labs    04/11/18 0410  HGB 8.0*   Recent Labs    04/11/18 0410  WBC 11.5*  RBC 2.64*  HCT 25.5*  PLT 243   Recent Labs    04/11/18 0410  NA 135  K 4.3  CL 101  CO2 24  BUN 24*  CREATININE 1.65*  GLUCOSE 158*  CALCIUM 8.7*   No results for input(s): LABPT, INR in the last 72 hours.  Sensation intact distally Intact pulses distally Dorsiflexion/Plantar flexion intact Incision: dressing C/D/I  Assessment/Plan: 1 Day Post-Op Procedure(s) (LRB): RIGHT TOTAL HIP ARTHROPLASTY ANTERIOR APPROACH (Right) Up with therapy - full weight bearing as tolerated. Monitor H&H Social Work consult for return to [facility] by Friday.    Kathryne HitchChristopher Y Tyna Huertas 04/11/2018, 7:18 AM

## 2018-04-11 NOTE — Plan of Care (Signed)
  Problem: Pain Managment: Goal: General experience of comfort will improve Outcome: Progressing   Problem: Safety: Goal: Ability to remain free from injury will improve Outcome: Progressing   

## 2018-04-11 NOTE — Addendum Note (Signed)
Addendum  created 04/11/18 1141 by Lowella Curb, MD   Intraprocedure Staff edited

## 2018-04-12 LAB — GLUCOSE, CAPILLARY
Glucose-Capillary: 146 mg/dL — ABNORMAL HIGH (ref 70–99)
Glucose-Capillary: 135 mg/dL — ABNORMAL HIGH (ref 70–99)
Glucose-Capillary: 171 mg/dL — ABNORMAL HIGH (ref 70–99)

## 2018-04-12 LAB — CBC
HCT: 22.3 % — ABNORMAL LOW (ref 39.0–52.0)
Hemoglobin: 6.9 g/dL — CL (ref 13.0–17.0)
MCH: 29.2 pg (ref 26.0–34.0)
MCHC: 30.9 g/dL (ref 30.0–36.0)
MCV: 94.5 fL (ref 80.0–100.0)
Platelets: 184 10*3/uL (ref 150–400)
RBC: 2.36 MIL/uL — ABNORMAL LOW (ref 4.22–5.81)
RDW: 14.5 % (ref 11.5–15.5)
WBC: 8.5 10*3/uL (ref 4.0–10.5)
nRBC: 0 % (ref 0.0–0.2)

## 2018-04-12 LAB — URINE CULTURE: Culture: >=100000 — AB

## 2018-04-12 LAB — PREPARE RBC (CROSSMATCH)

## 2018-04-12 LAB — ABO/RH: ABO/RH(D): A POS

## 2018-04-12 MED ORDER — HYDROCODONE-ACETAMINOPHEN 5-325 MG PO TABS: 1.0000 | ORAL_TABLET | ORAL | 0 refills | Status: AC | PRN

## 2018-04-12 MED ORDER — ASPIRIN 81 MG PO CHEW: 81.0000 mg | CHEWABLE_TABLET | Freq: Two times a day (BID) | ORAL | 0 refills | Status: AC

## 2018-04-12 MED ORDER — SODIUM CHLORIDE 0.9% IV SOLUTION: Freq: Once | INTRAVENOUS | Status: DC

## 2018-04-12 MED ORDER — METHOCARBAMOL 500 MG PO TABS: 500.0000 mg | ORAL_TABLET | Freq: Four times a day (QID) | ORAL | 0 refills | Status: AC | PRN

## 2018-04-12 MED ORDER — FUROSEMIDE 10 MG/ML IJ SOLN
20.0000 mg | Freq: Once | INTRAMUSCULAR | Status: AC
  Administered 2018-04-12: 20 mg via INTRAVENOUS
  Filled 2018-04-12: qty 2

## 2018-04-12 NOTE — Progress Notes (Signed)
Patient ID: Bruce Garcia, male   DOB: May 14, 1950, 67 y.o.   MRN: [id number] No acute changes.  Did get a transfusion today.  I just changed his right hip dressing at the bedside and his incision looks good.  Can go to skilled nursing tomorrow (1/31).

## 2018-04-12 NOTE — Progress Notes (Signed)
CSW was notified by [facility] that patient will dc to [facility] when medically ready and contact for facility is Dewitt Rota at [phone]. Bed avail, CSW will notify SNF when patient medically ready to return.   [place], [place] [phone]

## 2018-04-12 NOTE — Progress Notes (Signed)
Patient ID: Bruce Garcia, male   DOB: [birth year], 68 y.o.   MRN: [id number] Will transfuse PRBC's today due to acute on chronic blood loss anemia.  Vitals otherwise stable and right hip is stable.  The plan is to discharge to skilled nursing tomorrow.

## 2018-04-12 NOTE — Discharge Instructions (Signed)

## 2018-04-12 NOTE — Progress Notes (Signed)
CRITICAL VALUE ALERT  Critical Value: Hemoglobin 8.6  Date & Time Notied:  04-12-18 / 3:20 am  Provider Notified: Yes  Orders Received/Actions taken: Yes

## 2018-04-13 LAB — TYPE AND SCREEN
ABO/RH(D): A POS
Antibody Screen: NEGATIVE
Unit division: 0
Unit division: 0

## 2018-04-13 LAB — CBC
HCT: 29.3 % — ABNORMAL LOW (ref 39.0–52.0)
Hemoglobin: 9.8 g/dL — ABNORMAL LOW (ref 13.0–17.0)
MCH: 30.2 pg (ref 26.0–34.0)
MCHC: 33.4 g/dL (ref 30.0–36.0)
MCV: 90.4 fL (ref 80.0–100.0)
Platelets: 219 10*3/uL (ref 150–400)
RBC: 3.24 MIL/uL — ABNORMAL LOW (ref 4.22–5.81)
RDW: 14.7 % (ref 11.5–15.5)
WBC: 10.9 10*3/uL — ABNORMAL HIGH (ref 4.0–10.5)
nRBC: 0 % (ref 0.0–0.2)

## 2018-04-13 LAB — BPAM RBC
Blood Product Expiration Date: 202002032359
Blood Product Expiration Date: 202002222359
ISSUE DATE / TIME: 202001301049
ISSUE DATE / TIME: 202001301414
Unit Type and Rh: 600
Unit Type and Rh: 6200

## 2018-04-13 LAB — GLUCOSE, CAPILLARY
GLUCOSE-CAPILLARY: 131 mg/dL — AB (ref 70–99)
Glucose-Capillary: 172 mg/dL — ABNORMAL HIGH (ref 70–99)

## 2018-04-13 NOTE — Clinical Social Work Placement (Signed)
   CLINICAL SOCIAL WORK PLACEMENT  NOTE  Date:  04/13/2018  Patient Details  Name: Bruce Garcia MRN: [id number] Date of Birth: 08-Mar-1951  Clinical Social Work is seeking post-discharge placement for this patient at the Skilled  Nursing Facility level of care (*CSW will initial, date and re-position this form in  chart as items are completed):      Patient/family provided with [facility] Clinical Social Work Department's list of facilities offering this level of care within the geographic area requested by the patient (or if unable, by the patient's family).  Yes   Patient/family informed of their freedom to choose among providers that offer the needed level of care, that participate in Medicare, Medicaid or managed care program needed by the patient, have an available bed and are willing to accept the patient.      Patient/family informed of [facility]'s ownership interest in [facility] and [facility], as well as of the fact that they are under no obligation to receive care at these facilities.  PASRR submitted to EDS on       PASRR number received on 04/11/18     Existing PASRR number confirmed on       FL2 transmitted to all facilities in geographic area requested by pt/family on 04/11/18     FL2 transmitted to all facilities within larger geographic area on       Patient informed that his/her managed care company has contracts with or will negotiate with certain facilities, including the following:        Yes   Patient/family informed of bed offers received.  Patient chooses bed at Other - please specify in the comment section below:([facility])     Physician recommends and patient chooses bed at      Patient to be transferred to Other - please specify in the comment section below:([facility]) on 04/13/18.  Patient to be transferred to facility by PTAR     Patient family notified on 04/13/18 of transfer.  Name of family member notified:  none identified      PHYSICIAN       Additional Comment:    _______________________________________________ Gildardo Griffes, LCSW 04/13/2018, 8:38 AM

## 2018-04-13 NOTE — Progress Notes (Addendum)
1300 Attempted to give report to Chilton Si, nurse did not answer. Message left to reception desk with call back number. Discharged pt to SNF via PTAR. Discharge packet was given to [facility] personnel.  1400 Report was given to Frederico Hamman at [facility]. Pt's personal wheelchair placed at the front equipment room and will be picked up by [facility] representative/personnel.

## 2018-04-13 NOTE — Plan of Care (Signed)
  Problem: Clinical Measurements: Goal: Ability to maintain clinical measurements within normal limits will improve Outcome: Progressing   Problem: Activity: Goal: Risk for activity intolerance will decrease Outcome: Progressing   Problem: Pain Managment: Goal: General experience of comfort will improve Outcome: Progressing   

## 2018-04-13 NOTE — Discharge Summary (Signed)
Patient ID: Bruce Garcia MRN: [id number] DOB/AGE: [birth year] 68 y.o.  Admit date: 04/10/2018 Discharge date: 04/13/2018  Admission Diagnoses:  Principal Problem:   Closed displaced fracture of base of neck of right femur ([place]) Active Problems:   Status post total replacement of right hip   Discharge Diagnoses:  Same  Past Medical History:  Diagnosis Date  . COPD (chronic obstructive pulmonary disease) ([place])   . Depression   . Diabetes mellitus without complication ([place])    Type II  . GERD (gastroesophageal reflux disease)   . Hypercholesteremia   . Hypertension   . Inguinal hernia    Right    Surgeries: Procedure(s): RIGHT TOTAL HIP ARTHROPLASTY ANTERIOR APPROACH on 04/10/2018   Consultants:   Discharged Condition: Improved  Hospital Course: Bruce Garcia is an 68 y.o. male who was admitted 04/10/2018 for operative treatment ofClosed displaced fracture of base of neck of right femur ([place]). Patient has severe unremitting pain that affects sleep, daily activities, and work/hobbies. After pre-op clearance the patient was taken to the operating room on 04/10/2018 and underwent  Procedure(s): RIGHT TOTAL HIP ARTHROPLASTY ANTERIOR APPROACH.    Patient was given perioperative antibiotics:  Anti-infectives (From admission, onward)   Start     Dose/Rate Route Frequency Ordered Stop   04/10/18 1930  clindamycin (CLEOCIN) IVPB 600 mg     600 mg 100 mL/hr over 30 Minutes Intravenous Every 6 hours 04/10/18 1828 04/11/18 0155   04/10/18 1500  clindamycin (CLEOCIN) IVPB 900 mg     900 mg 100 mL/hr over 30 Minutes Intravenous To ShortStay [facility] 04/09/18 1205 04/10/18 1245       Patient was given sequential compression devices, early ambulation, and chemoprophylaxis to prevent DVT.  Patient benefited maximally from hospital stay and there were no complications.    Recent vital signs:  Patient Vitals for the past 24 hrs:  BP Temp Temp src Pulse Resp SpO2  04/13/18 0450  (!) 151/64 99.7 F (37.6 C) Oral (!) 104 16 97 %  04/12/18 2039 (!) 149/67 98.5 F (36.9 C) Oral (!) 105 16 95 %  04/12/18 1745 (!) 145/58 98.9 F (37.2 C) Oral (!) 113 14 97 %  04/12/18 1630 (!) 141/61 98.4 F (36.9 C) Oral (!) 108 16 91 %  04/12/18 1421 137/62 98.2 F (36.8 C) Oral (!) 111 14 92 %  04/12/18 1343 (!) 146/57 98.2 F (36.8 C) Oral (!) 114 14 92 %  04/12/18 1112 (!) 146/85 98.6 F (37 C) Oral (!) 123 14 100 %  04/12/18 1031 (!) 154/66 98.7 F (37.1 C) Oral (!) 120 14 96 %     Recent laboratory studies:  Recent Labs    04/11/18 0410 04/12/18 0257 04/13/18 0338  WBC 11.5* 8.5 10.9*  HGB 8.0* 6.9* 9.8*  HCT 25.5* 22.3* 29.3*  PLT 243 184 219  NA 135  --   --   K 4.3  --   --   CL 101  --   --   CO2 24  --   --   BUN 24*  --   --   CREATININE 1.65*  --   --   GLUCOSE 158*  --   --   CALCIUM 8.7*  --   --      Discharge Medications:   Allergies as of 04/13/2018      Reactions   Chicken Allergy Itching, Nausea And Vomiting, Swelling   SWELLING REACTION UNSPECIFIED    Penicillins Anaphylaxis  Did it involve swelling of the face/tongue/throat, SOB, or low BP? #  #  #  YES  #  #  #  Did it involve sudden or severe rash/hives, skin peeling, or any reaction on the inside of your mouth or nose? #  #  #  YES  #  #  #  Did you need to seek medical attention at a hospital or doctor's office? UNK When did it last happen?childhood   Flu Virus Vaccine Nausea And Vomiting   Pneumococcal Vaccine Nausea And Vomiting      Medication List    TAKE these medications   albuterol 108 (90 Base) MCG/ACT inhaler Commonly known as:  PROVENTIL HFA;VENTOLIN HFA Inhale 2 puffs into the lungs every 6 (six) hours as needed for wheezing or shortness of breath.   albuterol (2.5 MG/3ML) 0.083% nebulizer solution Commonly known as:  PROVENTIL Take 2.5 mg by nebulization every 6 (six) hours as needed for wheezing or shortness of breath.   amLODipine 10 MG tablet Commonly  known as:  NORVASC Take 1 tablet (10 mg total) by mouth daily.   ASPERCREME LIDOCAINE 4 % Ptch Generic drug:  Lidocaine Place 1 patch onto the skin daily. Apply to right hip for pain.   aspirin 81 MG chewable tablet Chew 1 tablet (81 mg total) by mouth 2 (two) times daily.   DULoxetine 60 MG capsule Commonly known as:  CYMBALTA Take 60 mg by mouth daily.   ferrous sulfate 325 (65 FE) MG tablet Take 325 mg by mouth daily with breakfast.   fluticasone 50 MCG/ACT nasal spray Commonly known as:  FLONASE Place 2 sprays into both nostrils daily as needed for allergies.   gabapentin 300 MG capsule Commonly known as:  NEURONTIN Take 2 capsules (600 mg total) by mouth 2 (two) times daily.   hydrALAZINE 25 MG tablet Commonly known as:  APRESOLINE Take 1 tablet (25 mg total) by mouth 2 (two) times daily.   HYDROcodone-acetaminophen 5-325 MG tablet Commonly known as:  NORCO/VICODIN Take 1-2 tablets by mouth every 4 (four) hours as needed for moderate pain. What changed:  when to take this   ketoconazole 2 % cream Commonly known as:  NIZORAL Apply 1 application topically daily.   ketoconazole 2 % shampoo Commonly known as:  NIZORAL Apply 1 application topically every Wednesday. Once a week on Wednesdays for 3 months   Melatonin 3 MG Tabs Take 6 mg by mouth at bedtime as needed (sleep).   methocarbamol 500 MG tablet Commonly known as:  ROBAXIN Take 1 tablet (500 mg total) by mouth every 6 (six) hours as needed for muscle spasms.   multivitamin with minerals Tabs tablet Take 1 tablet by mouth daily.   naloxone 4 MG/0.1ML Liqd nasal spray kit Commonly known as:  NARCAN Place 1 spray into the nose once.   nitroGLYCERIN 0.4 MG SL tablet Commonly known as:  NITROSTAT Place 0.4 mg under the tongue every 5 (five) minutes as needed for chest pain.   NOVOLOG 100 UNIT/ML injection Generic drug:  insulin aspart Inject 0-14 Units into the skin 3 (three) times daily before meals.  If blood sugar is less than 80, call MD. 0-200=0 units, 201-250=2 units, 251-300=4 units, 301-350=6 units, 351-400=8 units, 401-450=10 units; if blood sugar is greater than 500, give 14 units. If blood sugar is greater than 500, call MD.   omega-3 acid ethyl esters 1 g capsule Commonly known as:  LOVAZA Take 2 g by mouth 3 (three) times  daily.   polyethylene glycol packet Commonly known as:  MIRALAX / GLYCOLAX Take 17 g by mouth 2 (two) times daily.   senna-docusate 8.6-50 MG tablet Commonly known as:  Senokot-S Take 1 tablet by mouth 2 (two) times daily.   tamsulosin 0.4 MG Caps capsule Commonly known as:  FLOMAX Take 1 capsule (0.4 mg total) by mouth daily.   tiotropium 18 MCG inhalation capsule Commonly known as:  SPIRIVA Place 18 mcg into inhaler and inhale daily.       Diagnostic Studies: Dg Pelvis Portable  Result Date: 04/10/2018 CLINICAL DATA:  Post RIGHT total hip replacement EXAM: PORTABLE PELVIS 1-2 VIEWS COMPARISON:  Portable exam 1440 hours compared intraoperative images of 04/10/2018 FINDINGS: RIGHT hip prosthesis identified. Bones demineralized. No fracture, dislocation, or bone destruction. Skin clips and postsurgical changes of the soft tissues overlying the RIGHT hip region are identified. IMPRESSION: RIGHT hip prosthesis without acute complication. Electronically Signed   By: Lavonia Dana M.D.   On: 04/10/2018 15:08   Ct Hip Right Wo Contrast  Result Date: 03/31/2018 CLINICAL DATA:  68 year old male with right femoral neck fracture. EXAM: CT OF THE RIGHT HIP WITHOUT CONTRAST TECHNIQUE: Multidetector CT imaging of the right hip was performed according to the standard protocol. Multiplanar CT image reconstructions were also generated. COMPARISON:  Right hip radiograph dated 03/31/2018 and 10/29/2017 FINDINGS: Bones/Joint/Cartilage There is an impacted transverse fracture of the right femoral neck. There is no dislocation. There is advanced osteopenia. Ligaments  Suboptimally assessed by CT. Muscles and Tendons No acute findings. Soft tissues No acute findings. IMPRESSION: Impacted transverse fracture of the right femoral neck. Electronically Signed   By: Anner Crete M.D.   On: 03/31/2018 03:01   Dg Chest Portable 1 View  Result Date: 03/31/2018 CLINICAL DATA:  Preop for hip surgery. Hypertension. COPD. Smoker. EXAM: PORTABLE CHEST 1 VIEW COMPARISON:  None. FINDINGS: Midline trachea. Normal heart size. Atherosclerosis in the transverse aorta. No pleural effusion or pneumothorax. Mild hyperinflation. Clear lungs. IMPRESSION: 1. Hyperinflation, without acute disease. 2. Aortic atherosclerosis. Electronically Signed   By: Abigail Miyamoto M.D.   On: 03/31/2018 02:08   Dg Abd Portable 1v  Result Date: 03/31/2018 CLINICAL DATA:  Abdominal pain EXAM: PORTABLE ABDOMEN - 1 VIEW COMPARISON:  None. FINDINGS: No dilated small bowel loops. Moderate stool throughout the large bowel. Mild gaseous distention of the stomach. No evidence of pneumatosis or pneumoperitoneum. Clear lung bases. No radiopaque nephrolithiasis. Impacted right femoral neck fracture is partially visualized. IMPRESSION: Nonobstructive bowel gas pattern. Moderate colonic stool volume, suggesting constipation. Partially visualized impacted right femoral neck fracture. Electronically Signed   By: Ilona Sorrel M.D.   On: 03/31/2018 06:51   Dg C-arm 1-60 Min  Result Date: 04/10/2018 CLINICAL DATA:  68 year old male post right hip replacement. EXAM: OPERATIVE right HIP (WITH PELVIS IF PERFORMED) 2 VIEWS TECHNIQUE: Fluoroscopic spot image(s) were submitted for interpretation post-operatively. Fluoroscopic time: 20 seconds. COMPARISON:  None. FINDINGS: Post total right hip replacement which appears in satisfactory position without complication noted on frontal imaging. Mild left hip joint degenerative changes. IMPRESSION: Post total right hip replacement. Electronically Signed   By: Genia Del M.D.   On:  04/10/2018 14:18   Dg Hip Operative Unilat With Pelvis Right  Result Date: 04/10/2018 CLINICAL DATA:  68 year old male post right hip replacement. EXAM: OPERATIVE right HIP (WITH PELVIS IF PERFORMED) 2 VIEWS TECHNIQUE: Fluoroscopic spot image(s) were submitted for interpretation post-operatively. Fluoroscopic time: 20 seconds. COMPARISON:  None. FINDINGS: Post total right  hip replacement which appears in satisfactory position without complication noted on frontal imaging. Mild left hip joint degenerative changes. IMPRESSION: Post total right hip replacement. Electronically Signed   By: Genia Del M.D.   On: 04/10/2018 14:18   Dg Hip Unilat  With Pelvis 2-3 Views Right  Result Date: 03/31/2018 CLINICAL DATA:  Hip pain EXAM: DG HIP (WITH OR WITHOUT PELVIS) 2-3V RIGHT COMPARISON:  10/29/2017 FINDINGS: Superiorly displaced, overriding fracture of the right femoral neck. Femoral head remains situated in the acetabulum. No other pelvic fracture. No pelvic diastasis. IMPRESSION: Superiorly displaced, overriding fracture of the right femoral neck. Electronically Signed   By: Ulyses Jarred M.D.   On: 03/31/2018 01:05    Disposition: Discharge disposition: 03-Skilled [place] information for follow-up providers    Mcarthur Rossetti, MD. Schedule an appointment as soon as possible for a visit in 2 week(s).   Specialty:  Orthopedic Surgery Contact information: [place] [address] [phone]            Contact information for after-discharge care    Destination    [name] .   Service:  Skilled Nursing Contact information: [place] [phone]                   Signed: Mcarthur Rossetti 04/13/2018, 7:07 AM

## 2018-04-13 NOTE — Progress Notes (Signed)
Patient ID: Bruce Garcia, male   DOB: May 23, 1950, 68 y.o.   MRN: [id number] Responded well to transfusion.  Vitals stable.  Right hip stable.  Can be discharged to skilled nursing today.

## 2018-04-13 NOTE — Progress Notes (Signed)
Patient will DC to: [facility] Anticipated DC date: 04/13/2018 Family notified: non identified Transport IR:[facility]  Per MD patient ready for DC to [facility] . RN, patient, patient's family, and facility notified of DC. Discharge Summary sent to facility. RN given number for report [phone] Room A207. DC packet on chart. Ambulance transport requested for patient for 12:30 pm.  CSW signing off.  [place], [place] [phone]

## 2018-04-16 ENCOUNTER — Telehealth (INDEPENDENT_AMBULATORY_CARE_PROVIDER_SITE_OTHER): Payer: Self-pay

## 2018-04-16 NOTE — Telephone Encounter (Signed)
YES, FULL WEIGHT BEARING AS TOLERATED

## 2018-04-16 NOTE — Telephone Encounter (Signed)
Weight bearing as tolerated correct?

## 2018-04-16 NOTE — Telephone Encounter (Signed)
Mathis Fare, PT at [facility] would like to know patient's WB status?  Patient had right THA on Tuesday, 04/10/2018.  Please fax to [phone].  Cb# is [phone].  Please advise.  Thank you.

## 2018-05-03 ENCOUNTER — Inpatient Hospital Stay (INDEPENDENT_AMBULATORY_CARE_PROVIDER_SITE_OTHER): Payer: Self-pay | Admitting: Physician Assistant

## 2018-05-07 ENCOUNTER — Encounter (INDEPENDENT_AMBULATORY_CARE_PROVIDER_SITE_OTHER): Payer: Self-pay | Admitting: Physician Assistant

## 2018-05-07 ENCOUNTER — Ambulatory Visit (INDEPENDENT_AMBULATORY_CARE_PROVIDER_SITE_OTHER): Payer: Self-pay

## 2018-05-07 ENCOUNTER — Ambulatory Visit (INDEPENDENT_AMBULATORY_CARE_PROVIDER_SITE_OTHER): Payer: Self-pay | Admitting: Physician Assistant

## 2018-05-07 DIAGNOSIS — M25551 Pain in right hip: Secondary | ICD-10-CM

## 2018-05-07 DIAGNOSIS — T8189XA Other complications of procedures, not elsewhere classified, initial encounter: Secondary | ICD-10-CM

## 2018-05-07 MED ORDER — DOXYCYCLINE HYCLATE 100 MG PO TABS: 100.0000 mg | ORAL_TABLET | Freq: Two times a day (BID) | ORAL | Status: AC

## 2018-05-07 NOTE — Progress Notes (Signed)
HPI: Mr. Mcright follows up now 4-week status post right total hip arthroplasty due to right hip fracture.  He has been at [facility]  facility since being discharged from the hospital.  States he is been doing very well walking did get on an exercise bike this morning for about 30 minutes.  He has had no fevers chills shortness breath chest pain.  Physical exam: General well-developed well-nourished male no acute distress. Right hip: Good range of motion without significant pain.  Right calf supple nontender.  Dorsiflexion plantarflexion ankle intact.  Surgical incision erythema due to staple irritation staples well approximate skin.  Approximately 1 inch area of eschar mid incision.  No no obvious drainage.  Radiographs: AP pelvis lateral view of the right hip show well-seated right total hip arthroplasty without evidence of complication.  No acute fracture.  Impression: Status post right total hip arthroplasty 04/10/2018  Plan: Due to the erythema about the incision and eschar will place him on doxycycline 100 mg twice daily for the next 2 weeks.  He will wash the wound with antibacterial soap daily.  Continue work with therapy for range of motion strengthening gait and balance.  Staples were removed today Steri-Strips applied.  Follow-up with [place] in 1 week for wound check.

## 2018-05-14 ENCOUNTER — Ambulatory Visit (INDEPENDENT_AMBULATORY_CARE_PROVIDER_SITE_OTHER): Payer: Self-pay | Admitting: Physician Assistant

## 2018-05-14 ENCOUNTER — Encounter (INDEPENDENT_AMBULATORY_CARE_PROVIDER_SITE_OTHER): Payer: Self-pay | Admitting: Physician Assistant

## 2018-05-14 DIAGNOSIS — Z96641 Presence of right artificial hip joint: Secondary | ICD-10-CM

## 2018-05-14 NOTE — Progress Notes (Signed)
HPI: Mr. Spragg returns now 5 weeks status post right total hip arthroplasty due to a hip fracture.  He states he is now walking with a walker.  Returns mainly today for wound check.  Right hip: Good range of motion.  Right calf supple nontender.  Surgical incision decreased erythema.  Wound is well approximated.  Mid incision 1 inch area of eschar otherwise the incision is healing well no obvious drainage.  Impression: 5-week status post right total hip arthroplasty secondary to hip fracture  Plan: Continue to wash the incision daily with an antibacterial soap.  We will continue him on his doxycycline for an additional 2 weeks.  Sample of mupirocin is given he will apply thin amount to the right hip incision over the area of eschar daily.  Follow-up with [place] in 2 weeks sooner if there is any questions concerns.

## 2018-05-28 ENCOUNTER — Encounter (INDEPENDENT_AMBULATORY_CARE_PROVIDER_SITE_OTHER): Payer: Self-pay | Admitting: Physician Assistant

## 2018-05-28 ENCOUNTER — Ambulatory Visit (INDEPENDENT_AMBULATORY_CARE_PROVIDER_SITE_OTHER): Payer: Self-pay | Admitting: Physician Assistant

## 2018-05-28 ENCOUNTER — Other Ambulatory Visit: Payer: Self-pay

## 2018-05-28 DIAGNOSIS — Z96641 Presence of right artificial hip joint: Secondary | ICD-10-CM

## 2018-05-28 NOTE — Progress Notes (Signed)
HPI: Mr. Rummer returns today for wound check of his right hip status post right total hip arthroplasty.  He states he is used all the mupirocin ointment that he had.  He remains in skilled facility.  He also states that he is finished with physical therapy at this point in time however he does not ambulate daily.  Physical exam: Right hip mid to proximal incision area approximately size of a quarter with wound breakdown there is a good granular tissue under fibrinous tissue.  The area of eschar is also present.  The remainder the wound is well-healed.  There is no evidence of purulence or infection.  Right hip good range of motion without significant pain calf supple nontender.  Impression: Status post right total hip arthroplasty 04/10/2018  Plan: Recommend that he do dry to dry dressing changes of the wound and applying a small amount of mupirocin after washing the area with an antibacterial soap daily and drying it completely.  Also recommend skilled facility have a wound care assess the wound and help him with applying just a small amount of mupirocin over this area.  We will see him back in 1 week for wound check.  Recommend he continue with physical therapy to work on gait/balance range of motion and strengthening of the right hip.  Questions encouraged and answered.

## 2018-06-04 ENCOUNTER — Telehealth (INDEPENDENT_AMBULATORY_CARE_PROVIDER_SITE_OTHER): Payer: Self-pay

## 2018-06-04 ENCOUNTER — Ambulatory Visit (INDEPENDENT_AMBULATORY_CARE_PROVIDER_SITE_OTHER): Payer: Self-pay | Admitting: Physician Assistant

## 2018-06-04 NOTE — Telephone Encounter (Signed)
Called and left a VM for patient to call back to confirm appointment for Monday, 06/04/2018 and to answer COVID-19 screening questions.

## 2019-01-13 DEATH — deceased

## 2019-11-30 IMAGING — DX DG PORTABLE PELVIS
2 series · 2 of 2 positions shown · non-contrast
Comparison: Portable exam 6885 hours compared intraoperative images
of 04/10/2018

CLINICAL DATA: Post RIGHT total hip replacement

EXAM:
PORTABLE PELVIS 1-2 VIEWS

[pelvis ap (1 of 2)]
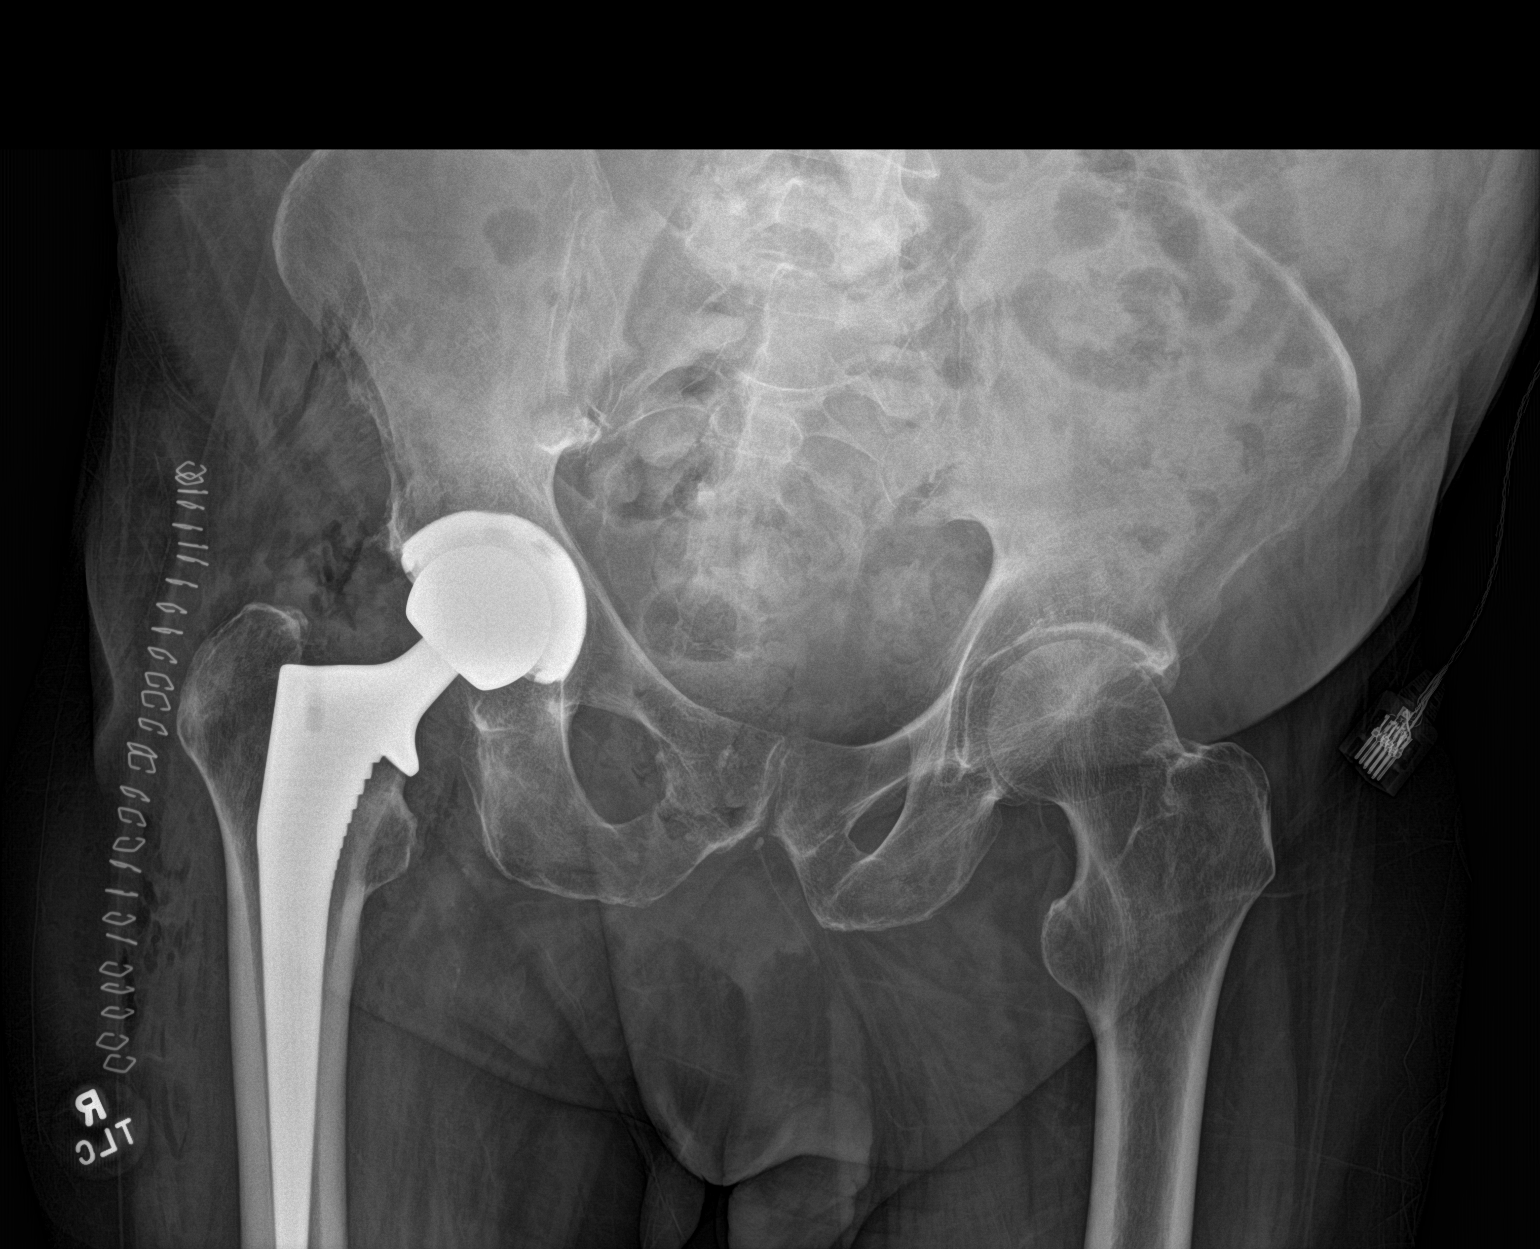

[pelvis ap (2 of 2)]
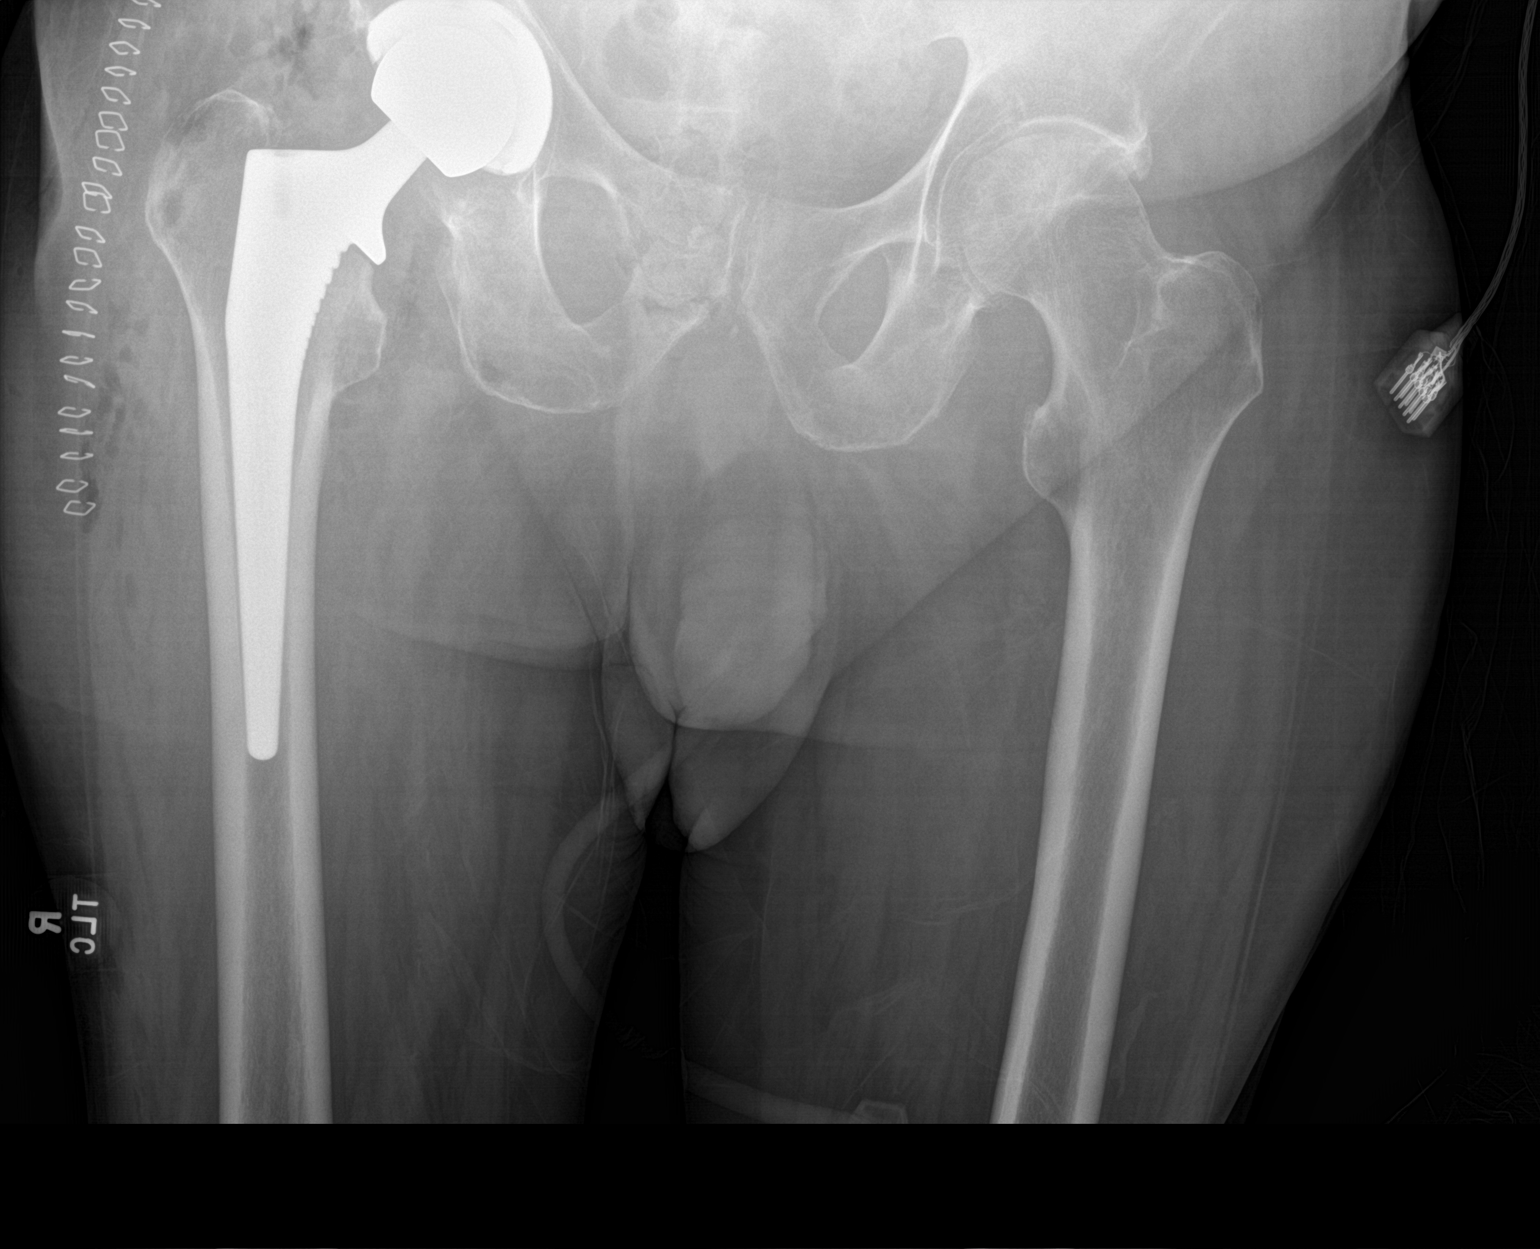

[2 of 2 positions shown; findings below may reference images not displayed]

FINDINGS: RIGHT hip prosthesis identified.

Bones demineralized.

No fracture, dislocation, or bone destruction.

Skin clips and postsurgical changes of the soft tissues overlying
the RIGHT hip region are identified.
IMPRESSION: RIGHT hip prosthesis without acute complication.
